# Patient Record
Sex: Female | Born: 1990 | Hispanic: Yes | Marital: Married | Smoking: Never smoker
Health system: Southern US, Community
[De-identification: ages and names within clinical notes are randomized; demographics above are authoritative.]

## PROBLEM LIST (undated history)

## (undated) DIAGNOSIS — E663 Overweight: Secondary | ICD-10-CM

## (undated) DIAGNOSIS — K8020 Calculus of gallbladder without cholecystitis without obstruction: Secondary | ICD-10-CM

## (undated) DIAGNOSIS — Z789 Other specified health status: Secondary | ICD-10-CM

## (undated) HISTORY — DX: Overweight: E66.3

## (undated) HISTORY — PX: NO PAST SURGERIES: SHX2092

## (undated) HISTORY — DX: Calculus of gallbladder without cholecystitis without obstruction: K80.20

---

## 2010-10-23 NOTE — L&D Delivery Note (Signed)
Delivery Note At 12:43 AM a viable female was delivered via SVD.  APGAR: 9, 9; weight 3240g.   Placenta status: spontaneous delivery, intanct .  Cord: 3 vessel  with the following complications: none .  Cord pH: n/a  Anesthesia:  None Episiotomy: None Lacerations: None Est. Blood Loss (mL):  Mom to postpartum.  Baby to nursery-stable.  Drucie Ip 09/13/2011, 1:35 AM

## 2011-06-07 ENCOUNTER — Other Ambulatory Visit: Payer: Self-pay | Admitting: Family Medicine

## 2011-06-07 DIAGNOSIS — Z3689 Encounter for other specified antenatal screening: Secondary | ICD-10-CM

## 2011-06-07 LAB — ANTIBODY SCREEN: Antibody Screen: NEGATIVE

## 2011-06-07 LAB — RPR: RPR: NONREACTIVE

## 2011-06-07 LAB — CYTOLOGY - PAP
Glucose, 1 Hour GTT: 83
Hemoglobin, Elp: NORMAL

## 2011-06-07 LAB — HEPATITIS B SURFACE ANTIGEN: Hepatitis B Surface Ag: NEGATIVE

## 2011-06-07 LAB — ABO/RH: RH Type: POSITIVE

## 2011-06-07 LAB — CBC: Platelets: 189 10*3/uL (ref 150–399)

## 2011-06-07 LAB — GC/CHLAMYDIA PROBE AMP, GENITAL: Chlamydia: NEGATIVE

## 2011-06-08 ENCOUNTER — Ambulatory Visit (HOSPITAL_COMMUNITY)
Admission: RE | Admit: 2011-06-08 | Discharge: 2011-06-08 | Disposition: A | Discharge status: Home / Self Care | Payer: Medicaid Other | Source: Ambulatory Visit | Attending: Family Medicine | Admitting: Family Medicine

## 2011-06-08 DIAGNOSIS — Z363 Encounter for antenatal screening for malformations: Secondary | ICD-10-CM | POA: Insufficient documentation

## 2011-06-08 DIAGNOSIS — Z3689 Encounter for other specified antenatal screening: Secondary | ICD-10-CM

## 2011-06-08 DIAGNOSIS — Z1389 Encounter for screening for other disorder: Secondary | ICD-10-CM | POA: Insufficient documentation

## 2011-06-08 DIAGNOSIS — O358XX0 Maternal care for other (suspected) fetal abnormality and damage, not applicable or unspecified: Secondary | ICD-10-CM | POA: Insufficient documentation

## 2011-06-08 DIAGNOSIS — O0930 Supervision of pregnancy with insufficient antenatal care, unspecified trimester: Secondary | ICD-10-CM | POA: Insufficient documentation

## 2011-06-08 LAB — US OB DETAIL + 14 WK

## 2011-06-08 IMAGING — US US OB DETAIL+14 WK
1 series · 12 of 28 positions shown · non-contrast
Comparison: none

[Series 1: us ob detail +14 wk · 80 acquisitions, 12 frames shown]
[im 3/80]
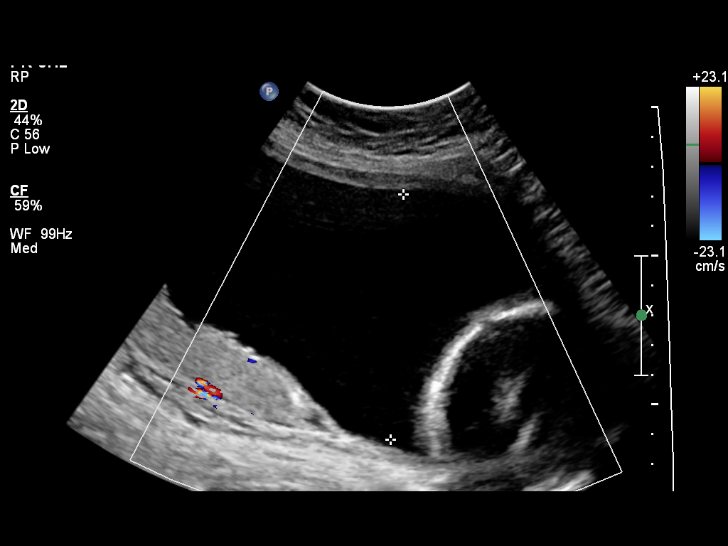
[im 9/80]
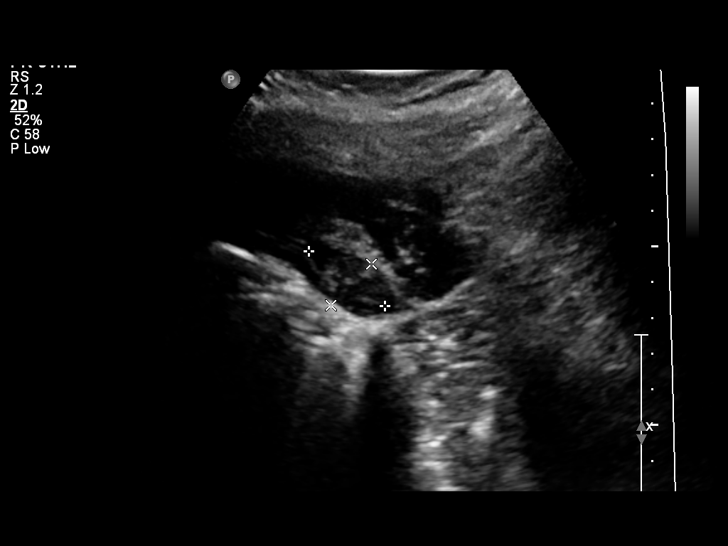
[im 15/80]
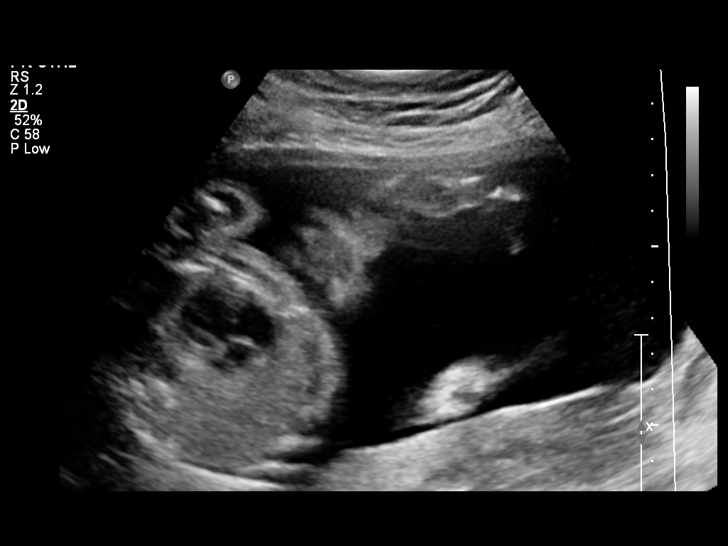
[im 24/80]
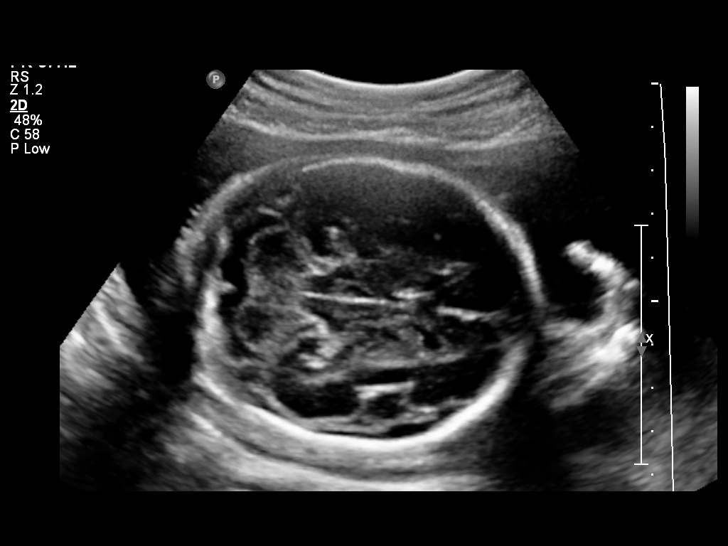
[im 30/80]
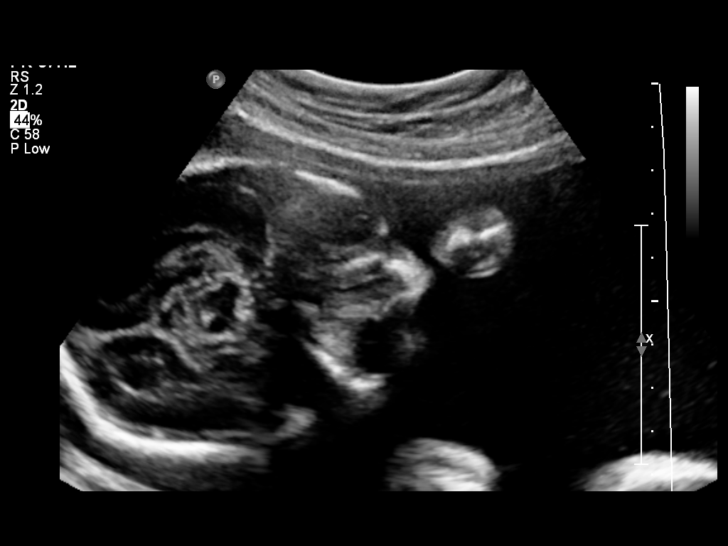
[im 36/80]
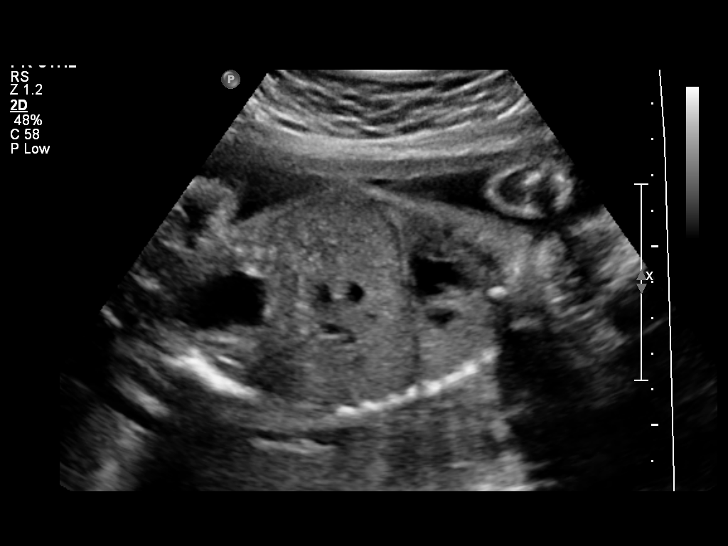
[im 44/80]
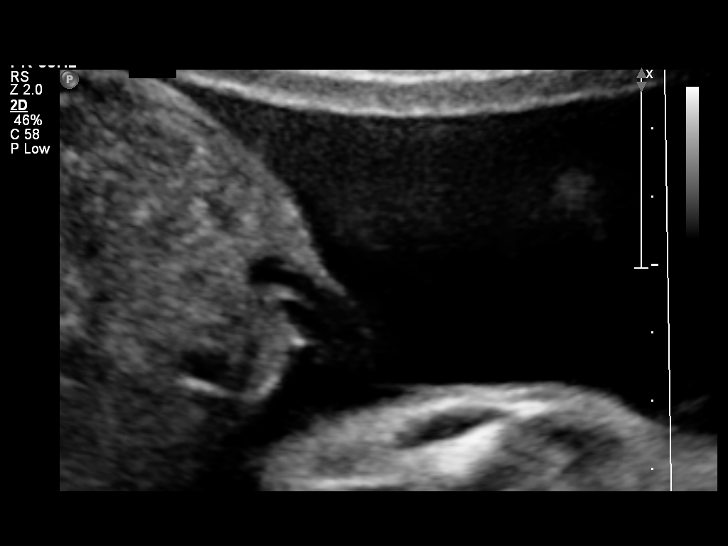
[im 50/80]
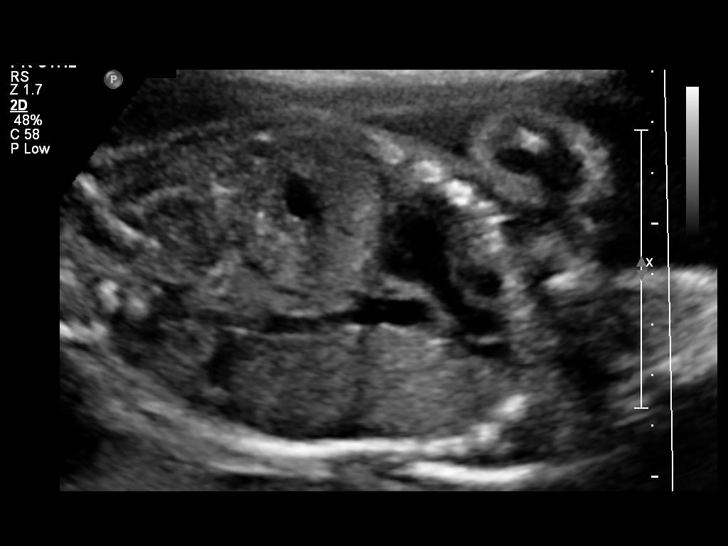
[im 56/80]
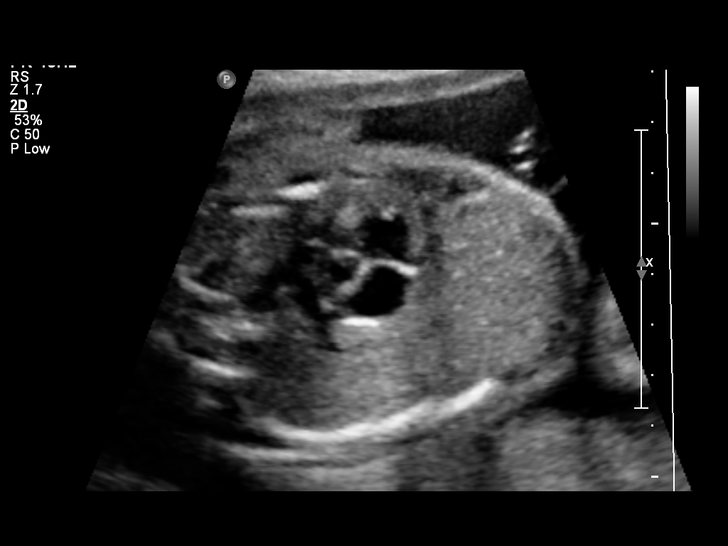
[im 65/80]
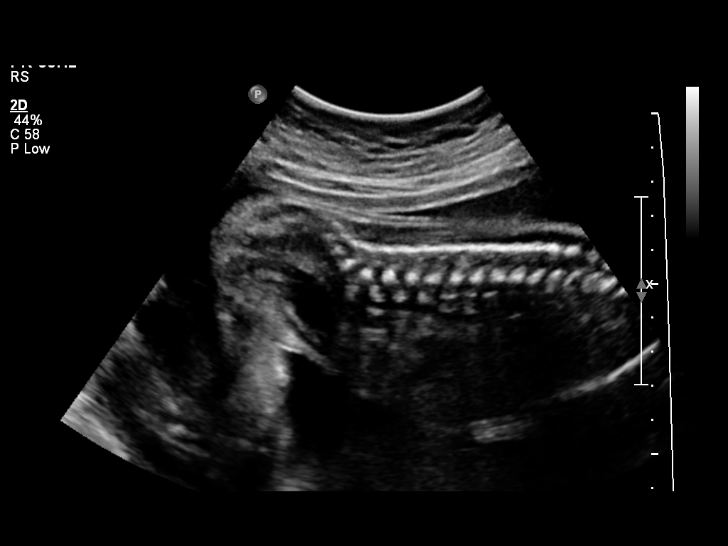
[im 71/80]
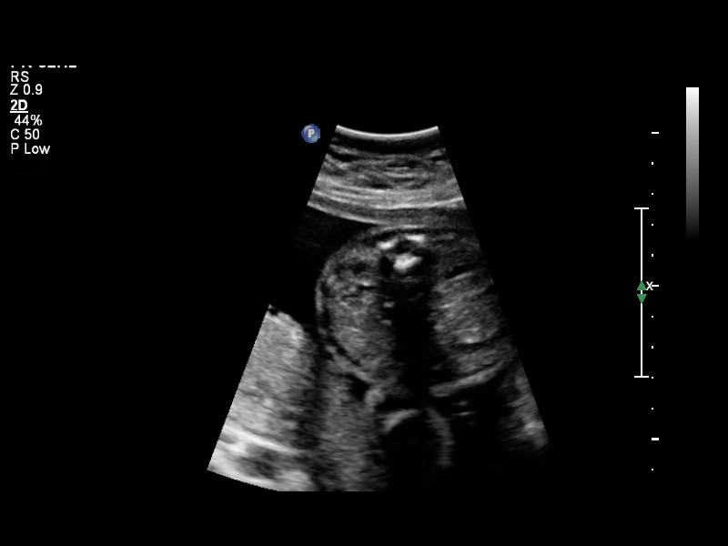
[im 77/80]
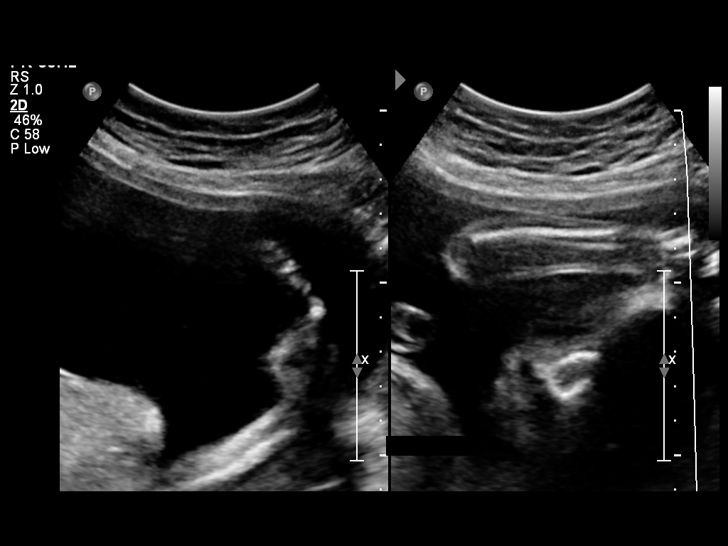

[12 of 28 positions shown; findings below may reference images not displayed]

OBSTETRICS REPORT
                      (Signed Final [DATE] [DATE])

 Order#:         [PHONE_NUMBER]_O
Procedures

 US OB DETAIL + 14 WK                                  76811.0
Indications

 No or Little Prenatal Care                            [02]
 Detailed fetal anatomic survey                        655.83 [02]
Fetal Evaluation

 Fetal Heart Rate:  153                          bpm
 Cardiac Activity:  Observed
 Presentation:      Cephalic
 Placenta:          Posterior, above cervical
                    os
 P. Cord            Visualized
 Insertion:

 Amniotic Fluid
 AFI FV:      Subjectively within normal limits
                                             Larg Pckt:     8.2  cm
Biometry

 BPD:     65.4  mm     G. Age:  26w 3d                CI:        72.76   70 - 86
                                                      FL/HC:      18.0   18.6 -

 HC:     243.8  mm     G. Age:  26w 3d       42  %    HC/AC:      1.11   1.04 -

 AC:     219.8  mm     G. Age:  26w 3d       55  %    FL/BPD:     67.1   71 - 87
 FL:      43.9  mm     G. Age:  24w 3d        5  %    FL/AC:      20.0   20 - 24

 Est. FW:     845  gm    1 lb 14 oz      49  %
Gestational Age

 LMP:           21w 3d        Date:  [DATE]                 EDD:   [DATE]
 U/S Today:     26w 0d                                        EDD:   [DATE]
 Best:          26w 0d     Det. By:  U/S ([DATE])           EDD:   [DATE]
Anatomy

 Cranium:           Appears normal      Aortic Arch:       Appears normal
 Fetal Cavum:       Appears normal      Ductal Arch:       Appears normal
 Ventricles:        Appears normal      Diaphragm:         Appears normal
 Choroid Plexus:    Appears normal      Stomach:           Appears
                                                           normal, left
                                                           sided
 Cerebellum:        Appears normal      Abdomen:           Appears normal
 Posterior Fossa:   Appears normal      Abdominal Wall:    Appears nml
                                                           (cord insert,
                                                           abd wall)
 Nuchal Fold:       Not applicable      Cord Vessels:      Appears normal
                    (>20 wks GA)                           (3 vessel cord)
 Face:              Appears normal      Kidneys:           Appear normal
                    (lips/profile/orbit
                    s)
 Heart:             Appears normal      Bladder:           Appears normal
                    (4 chamber &
                    axis)
 RVOT:              Appears normal      Spine:             Appears normal
 LVOT:              Appears normal      Limbs:             Appears normal
                                                           (hands, ankles,
                                                           feet)

 Other:     Heels and 5th digit visualized. Fetus appears to be a
            male.
Cervix Uterus Adnexa

 Cervical Length:    3.8      cm

 Cervix:       Normal appearance by transabdominal scan.
 Left Ovary:    Not visualized.
 Right Ovary:   Within normal limits.

 Adnexa:     No abnormality visualized.
Impression

 Single intrauterine gestation demonstrating an estimated
 gestational age by ultrasound of 26w 0d  . This is 4w 4d
 ahead of expected EGA by LMP of 21w 3d and corresponds
 with best dating by today's exam  .

 No focal fetal or placental abnormalities are noted with a
 good anatomic evaluation possible. No soft markers for Down
 Syndrome are seen. Sonographic modification of Down
 Syndrome risk was not performed as the patient is beyond
 the EGA at which this assessment is typically performed.

 Subjectively and quantitatively normal amniotic fluid volume.
 Normal cervical length and appearance.

## 2011-06-19 DIAGNOSIS — K8020 Calculus of gallbladder without cholecystitis without obstruction: Secondary | ICD-10-CM

## 2011-07-07 ENCOUNTER — Inpatient Hospital Stay (HOSPITAL_COMMUNITY)
Admission: AD | Admit: 2011-07-07 | Discharge: 2011-07-08 | Disposition: A | Discharge status: Home / Self Care | Payer: Medicaid Other | Source: Ambulatory Visit | Attending: Obstetrics and Gynecology | Admitting: Obstetrics and Gynecology

## 2011-07-07 ENCOUNTER — Encounter (HOSPITAL_COMMUNITY): Payer: Self-pay | Admitting: *Deleted

## 2011-07-07 ENCOUNTER — Inpatient Hospital Stay (HOSPITAL_COMMUNITY): Payer: Medicaid Other

## 2011-07-07 DIAGNOSIS — K8020 Calculus of gallbladder without cholecystitis without obstruction: Secondary | ICD-10-CM | POA: Insufficient documentation

## 2011-07-07 DIAGNOSIS — R1011 Right upper quadrant pain: Secondary | ICD-10-CM | POA: Insufficient documentation

## 2011-07-07 DIAGNOSIS — O2390 Unspecified genitourinary tract infection in pregnancy, unspecified trimester: Secondary | ICD-10-CM | POA: Insufficient documentation

## 2011-07-07 DIAGNOSIS — O9989 Other specified diseases and conditions complicating pregnancy, childbirth and the puerperium: Secondary | ICD-10-CM | POA: Insufficient documentation

## 2011-07-07 DIAGNOSIS — B3731 Acute candidiasis of vulva and vagina: Secondary | ICD-10-CM | POA: Insufficient documentation

## 2011-07-07 DIAGNOSIS — B373 Candidiasis of vulva and vagina: Secondary | ICD-10-CM | POA: Insufficient documentation

## 2011-07-07 HISTORY — DX: Other specified health status: Z78.9

## 2011-07-07 LAB — DIFFERENTIAL
Basophils Absolute: 0 10*3/uL (ref 0.0–0.1)
Basophils Relative: 0 % (ref 0–1)
Eosinophils Absolute: 0 10*3/uL (ref 0.0–0.7)
Monocytes Relative: 5 % (ref 3–12)
Neutro Abs: 8.6 10*3/uL — ABNORMAL HIGH (ref 1.7–7.7)
Neutrophils Relative %: 82 % — ABNORMAL HIGH (ref 43–77)

## 2011-07-07 LAB — URINALYSIS, ROUTINE W REFLEX MICROSCOPIC
Ketones, ur: NEGATIVE mg/dL
Protein, ur: NEGATIVE mg/dL
Urobilinogen, UA: 1 mg/dL (ref 0.0–1.0)

## 2011-07-07 LAB — COMPREHENSIVE METABOLIC PANEL
ALT: 51 U/L — ABNORMAL HIGH (ref 0–35)
AST: 86 U/L — ABNORMAL HIGH (ref 0–37)
Albumin: 2.7 g/dL — ABNORMAL LOW (ref 3.5–5.2)
Alkaline Phosphatase: 154 U/L — ABNORMAL HIGH (ref 39–117)
Chloride: 104 mEq/L (ref 96–112)
Potassium: 3.8 mEq/L (ref 3.5–5.1)
Sodium: 135 mEq/L (ref 135–145)
Total Bilirubin: 0.3 mg/dL (ref 0.3–1.2)
Total Protein: 6.5 g/dL (ref 6.0–8.3)

## 2011-07-07 LAB — WET PREP, GENITAL
Clue Cells Wet Prep HPF POC: NONE SEEN
Trich, Wet Prep: NONE SEEN

## 2011-07-07 LAB — CBC
Hemoglobin: 10.9 g/dL — ABNORMAL LOW (ref 12.0–15.0)
MCHC: 34.1 g/dL (ref 30.0–36.0)
Platelets: 226 10*3/uL (ref 150–400)
RDW: 12.4 % (ref 11.5–15.5)

## 2011-07-07 LAB — URINE MICROSCOPIC-ADD ON

## 2011-07-07 MED ORDER — OXYCODONE-ACETAMINOPHEN 5-325 MG PO TABS
2.0000 | ORAL_TABLET | ORAL | Status: AC | PRN
Start: 2011-07-07 — End: 2011-07-17

## 2011-07-07 MED ORDER — FLUCONAZOLE 150 MG PO TABS
150.0000 mg | ORAL_TABLET | Freq: Once | ORAL | Status: AC
  Administered 2011-07-08: 150 mg via ORAL
  Filled 2011-07-07: qty 1

## 2011-07-07 NOTE — Progress Notes (Signed)
Pt states, " I started vomiting and having pain in my upper abd at 2 pm today. It feels like it radiates into my right low back. I vomited two times, and I've been dizzy."

## 2011-07-07 NOTE — ED Provider Notes (Signed)
History   Patient is a 44-yo G2P1 who presents to [facility]U with worsening of RUQ pain that radiates into her back and around her RIGHT side. She states that she was told she had gall stones during her last pregnancy. She was given medications for pain and told to come in after her pregnancy if her pain returned. She has not had the pain again until recently. Today she vomited x 2, and has been afraid to eat. She last ate around noon. She reports that spicy foods seem to make the pain come on and become worse. She states her pain has lessened some since it started. She denies any diarrhea or fevers. Denies vaginal discharge or vaginal bleeding; her last intercourse was 2 days ago. She has been having contractions on the monitor, but she is not really feeling them by her report. She denies any history of STDs. She reports good fetal movement. She has had no complications with this pregnancy and [facility]  Chief Complaint  Patient presents with  . Abdominal Pain  . Emesis During Pregnancy  . Back Pain   HPI   Past Medical History  Diagnosis Date  . No pertinent past medical history     Past Surgical History  Procedure Date  . No past surgeries     No family history on file.  History  Substance Use Topics  . Smoking status: Never Smoker   . Smokeless tobacco: Not on file  . Alcohol Use: No    Allergies: No Known Allergies  Prescriptions prior to admission  Medication Sig Dispense Refill  . ibuprofen (ADVIL,MOTRIN) 400 MG tablet Take 400 mg by mouth every 6 (six) hours as needed. Patient took medication for pain.       . prenatal vitamin w/FE, FA (PRENATAL 1 + 1) 27-1 MG TABS Take 1 tablet by mouth daily.          Review of Systems  Unable to perform ROS Constitutional: Negative for fever and chills.  Eyes: Negative for blurred vision.  Respiratory: Negative for cough.   Cardiovascular: Negative for chest pain.  Gastrointestinal: Positive for heartburn and  abdominal pain. Negative for nausea, vomiting, diarrhea and constipation.  Genitourinary: Negative for dysuria, urgency, frequency, hematuria and flank pain.  Musculoskeletal: Negative for myalgias.  Skin: Negative for itching and rash.  Neurological: Negative for dizziness and headaches.  Psychiatric/Behavioral: Negative for depression.   Physical Exam   Blood pressure 102/58, pulse 74, temperature 98.6 F (37 C), temperature source Oral, resp. rate 18, height 4' 11.5" (1.511 m), weight 64.071 kg (141 lb 4 oz), unknown if currently breastfeeding.  Physical Exam  Constitutional: She is oriented to person, place, and time. She appears well-developed and well-nourished. She appears distressed.  HENT:  Head: Normocephalic.  Eyes: Pupils are equal, round, and reactive to light.  Neck: Normal range of motion.  Cardiovascular: Normal rate, regular rhythm, normal heart sounds and intact distal pulses.  Exam reveals no gallop and no friction rub.   No murmur heard. Respiratory: Effort normal and breath sounds normal. No respiratory distress. She has no wheezes. She has no rales. She exhibits no tenderness.  GI: Soft. Bowel sounds are normal. She exhibits no distension and no mass. There is tenderness. There is no rebound and no guarding.       +Murphy's sign, increased epigastric tenderness  Genitourinary: Uterus normal. Vaginal discharge found.       Moderate amount of white discharge from the  cervix; cervix with beefy red appearance; no CMT  Musculoskeletal: Normal range of motion.  Neurological: She is alert and oriented to person, place, and time. She has normal reflexes. She displays normal reflexes. No cranial nerve deficit. Coordination normal.  Skin: Skin is warm and dry. No rash noted. She is not diaphoretic. No erythema.   Dilation: Closed Effacement (%): 20 Cervical Position: Posterior Station: -3 Presentation: Undeterminable Exam by:: Dr Natale Milch   MAU Course   Procedures Results for orders placed during the hospital encounter of 07/07/11 (from the past 24 hour(s))  URINALYSIS, ROUTINE W REFLEX MICROSCOPIC     Status: Abnormal   Collection Time   07/07/11  8:03 PM      Component Value Range   Color, Urine YELLOW  YELLOW    Appearance CLEAR  CLEAR    Specific Gravity, Urine 1.015  1.005 - 1.030    pH 7.5  5.0 - 8.0    Glucose, UA NEGATIVE  NEGATIVE (mg/dL)   Hgb urine dipstick NEGATIVE  NEGATIVE    Bilirubin Urine NEGATIVE  NEGATIVE    Ketones, ur NEGATIVE  NEGATIVE (mg/dL)   Protein, ur NEGATIVE  NEGATIVE (mg/dL)   Urobilinogen, UA 1.0  0.0 - 1.0 (mg/dL)   Nitrite NEGATIVE  NEGATIVE    Leukocytes, UA SMALL (*) NEGATIVE   URINE MICROSCOPIC-ADD ON     Status: Normal   Collection Time   07/07/11  8:03 PM      Component Value Range   Squamous Epithelial / LPF RARE  RARE    WBC, UA 3-6  <3 (WBC/hpf)   Bacteria, UA RARE  RARE   CBC     Status: Abnormal   Collection Time   07/07/11  9:18 PM      Component Value Range   WBC 10.6 (*) 4.0 - 10.5 (K/uL)   RBC 3.45 (*) 3.87 - 5.11 (MIL/uL)   Hemoglobin 10.9 (*) 12.0 - 15.0 (g/dL)   HCT 16.1 (*) 09.6 - 46.0 (%)   MCV 92.8  78.0 - 100.0 (fL)   MCH 31.6  26.0 - 34.0 (pg)   MCHC 34.1  30.0 - 36.0 (g/dL)   RDW 04.5  40.9 - 81.1 (%)   Platelets 226  150 - 400 (K/uL)  DIFFERENTIAL     Status: Abnormal   Collection Time   07/07/11  9:18 PM      Component Value Range   Neutrophils Relative 82 (*) 43 - 77 (%)   Neutro Abs 8.6 (*) 1.7 - 7.7 (K/uL)   Lymphocytes Relative 13  12 - 46 (%)   Lymphs Abs 1.4  0.7 - 4.0 (K/uL)   Monocytes Relative 5  3 - 12 (%)   Monocytes Absolute 0.5  0.1 - 1.0 (K/uL)   Eosinophils Relative 0  0 - 5 (%)   Eosinophils Absolute 0.0  0.0 - 0.7 (K/uL)   Basophils Relative 0  0 - 1 (%)   Basophils Absolute 0.0  0.0 - 0.1 (K/uL)  COMPREHENSIVE METABOLIC PANEL     Status: Abnormal   Collection Time   07/07/11  9:18 PM      Component Value Range   Sodium 135  135 - 145  (mEq/L)   Potassium 3.8  3.5 - 5.1 (mEq/L)   Chloride 104  96 - 112 (mEq/L)   CO2 23  19 - 32 (mEq/L)   Glucose, Bld 76  70 - 99 (mg/dL)   BUN 7  6 - 23 (mg/dL)  Creatinine, Ser <0.47 (*) 0.50 - 1.10 (mg/dL)   Calcium 9.0  8.4 - 16.1 (mg/dL)   Total Protein 6.5  6.0 - 8.3 (g/dL)   Albumin 2.7 (*) 3.5 - 5.2 (g/dL)   AST 86 (*) 0 - 37 (U/L)   ALT 51 (*) 0 - 35 (U/L)   Alkaline Phosphatase 154 (*) 39 - 117 (U/L)   Total Bilirubin 0.3  0.3 - 1.2 (mg/dL)   GFR calc non Af Amer NOT CALCULATED  >60 (mL/min)   GFR calc Af Amer NOT CALCULATED  >60 (mL/min)  LIPASE, BLOOD     Status: Normal   Collection Time   07/07/11  9:18 PM      Component Value Range   Lipase 19  11 - 59 (U/L)  AMYLASE     Status: Normal   Collection Time   07/07/11  9:18 PM      Component Value Range   Amylase 57  0 - 105 (U/L)  WET PREP, GENITAL     Status: Abnormal   Collection Time   07/07/11 10:00 PM      Component Value Range   Yeast, Wet Prep MODERATE (*) NONE SEEN    Trich, Wet Prep NONE SEEN  NONE SEEN    Clue Cells, Wet Prep NONE SEEN  NONE SEEN    WBC, Wet Prep HPF POC MANY (*) NONE SEEN    *RADIOLOGY REPORT*  Clinical Data: Elevated LFTs; right upper quadrant abdominal pain,  nausea and vomiting.  ABDOMINAL ULTRASOUND COMPLETE  Comparison: None  Findings:  Gallbladder: Multiple mobile stones are seen layering dependently  within the gallbladder, with question of mild associated sludge.  The largest stone measures 1.0 cm in size. No gallbladder wall  thickening or pericholecystic fluid is seen to suggest  cholecystitis. No ultrasonographic Murphy's sign is elicited.  Common Bile Duct: 0.2 cm in diameter; within normal limits in  caliber.  Liver: Normal parenchymal echogenicity and echotexture; no focal  lesions identified. Limited Doppler evaluation demonstrates normal  blood flow within the liver.  IVC: Unremarkable in appearance.  Pancreas: Although the pancreas is difficult to visualize due  to  overlying bowel gas, no focal pancreatic abnormality is identified.  Spleen: 9.5 cm in length; within normal limits in size and  echotexture.  Right kidney: 9.3 cm in length; normal in size, configuration and  parenchymal echogenicity. No evidence of mass or hydronephrosis.  Left kidney: 10.1 cm in length; normal in size, configuration and  parenchymal echogenicity. No evidence of mass or hydronephrosis.  Abdominal Aorta: Normal in caliber; no aneurysm identified. Not  visualized distally due to the overlying uterus.  IMPRESSION:  1. Cholelithiasis, with mobile stones seen in the gallbladder and  question of mild associated sludge. No evidence for cholecystitis.  2. Otherwise unremarkable abdominal ultrasound.  Original Report Authenticated By: Tonia Ghent, M.D.   Assessment and Plan  1. IUP @ 30.[redacted] wks EGA: Category 1 tracing with contractions, no cervical dilation, wet prep w/ yeast. Pregnancy precautions provided. 2. Cholelithiasis: needs outpatient general surgery consult. Message left with Tresa Endo in [facility] to help with this and we will be contacting the patient Monday regarding any appointments. Precautions provided.  3. Vaginal Candidiasis: Will tx with diflucan x 1 in MAU prior to discharge.  Ellery Meroney N 07/07/2011, 10:05 PM

## 2011-07-07 NOTE — Progress Notes (Signed)
"  I ate lunch at 1200 and began to have pain in my upper right abd that radiated to my mid back and downward.  I vomited twice.  I had this same pain when I was pregnant with my last baby and the MDs told me it was gallbladder problems.  They prescribed my pain meds and everything was fine.  I never had to have my gallbladder removed after delivered."

## 2011-07-08 LAB — GC/CHLAMYDIA PROBE AMP, GENITAL: Chlamydia, DNA Probe: NEGATIVE

## 2011-07-13 ENCOUNTER — Encounter (INDEPENDENT_AMBULATORY_CARE_PROVIDER_SITE_OTHER): Payer: Self-pay | Admitting: Surgery

## 2011-07-13 ENCOUNTER — Ambulatory Visit (INDEPENDENT_AMBULATORY_CARE_PROVIDER_SITE_OTHER): Payer: Medicaid Other | Admitting: Surgery

## 2011-07-13 VITALS — BP 102/88 | HR 60 | Temp 96.6°F | Resp 16 | Ht 61.0 in | Wt 141.0 lb

## 2011-07-13 DIAGNOSIS — K8020 Calculus of gallbladder without cholecystitis without obstruction: Secondary | ICD-10-CM | POA: Insufficient documentation

## 2011-07-13 NOTE — Patient Instructions (Signed)
Discuss gall bladder disease with gyn doctor.  If there is a question, have her gyn doctor call me.

## 2011-07-13 NOTE — Progress Notes (Signed)
ASSESSMENT AND PLAN: 1.  Gallstones, symptomatic.  Given literature on gall bladder disease in Spanish.  She is not acutely ill.  She has no name of a Contractor.  She is going to be followed at [facility] going forward.  I discussed with the patient the indications and risks of gall bladder surgery.  The primary risks of gall bladder surgery include, but are not limited to, bleeding, infection, common bile duct injury, and open surgery.  There is also the risk that the patient may have continued symptoms after surgery. I tried to answer the patient's questions.  We will wait till after her pregnancy until we address this.  2.  Pregnant.  Due September 16, 2011.  Chief Complaint  Patient presents with  . Other    eval of GB with stones   REFERRING PHYSICIAN: Provider Not In System  HISTORY OF PRESENT ILLNESS: Diana Robertson is a 20 y.o. (DOB: Nov 22, 1990)  Hispanic female whose primary care physician is [facility] and comes to me today for gallstones.  The patient was found she has gallstones with her first child. That was 19 months ago. Because she was asymptomatic, I recommended no further treatment.  However over last week, she has had epigastric abdominal pain. A repeat ultrasound on 07 July 2011 revealed mobile gallstones. There was no evidence of cholecystitis. She has mildly elevated liver functions, but in the face of her pregnancy, I do not know what these mean.  She has an uncle and a cousin who have had gallbladder disease. She has no history of peptic ulcer disease, liver disease, pancreatic disease, or colon disease. She's had no prior abdominal surgery.  Elane Fritz acted as Equities trader.    Past Medical History  Diagnosis Date  . No pertinent past medical history     Past Surgical History  Procedure Date  . No past surgeries     Current Outpatient Prescriptions  Medication Sig Dispense Refill  . oxyCODONE-acetaminophen (PERCOCET) 5-325 MG per  tablet Take 2 tablets by mouth every 4 (four) hours as needed for pain.  15 tablet  0  . prenatal vitamin w/FE, FA (PRENATAL 1 + 1) 27-1 MG TABS Take 1 tablet by mouth daily.          No Known Allergies  REVIEW OF SYSTEMS: Skin:  No history of rash.  No history of abnormal moles. Infection:  No history of hepatitis or HIV.  No history of MRSA. Neurologic:  No history of stroke.  No history of seizure.  No history of headaches. Cardiac:  No history of hypertension. No history of heart disease.  No history of prior cardiac catheterization.  No history of seeing a cardiologist. Pulmonary:  Does not smoke cigarettes.  No asthma or bronchitis.  No OSA/CPAP.  Endocrine:  No diabetes. No thyroid disease. Gastrointestinal:  No history of stomach disease.  No history of liver disease.  No history of gall bladder disease.  No history of pancreas disease.  No history of colon disease. Urologic:  No history of kidney stones.  No history of bladder infections. Musculoskeletal:  No history of joint or back disease. Hematologic:  No bleeding disorder.  No history of anemia.  Not anticoagulated.  SOCIAL and FAMILY HISTORY:  Married.  Husband is not here. She has one child who is 39 months old. She is originally from [place]. PHYSICAL EXAM: BP 102/88  Pulse 60  Temp 96.6 F (35.9 C)  Resp 16  Ht 5\' 1"  (  1.549 m)  Wt 141 lb (63.957 kg)  BMI 26.64 kg/m2  General: WN Hispanic female. Pregnant.  Not very big. HEENT: Normal. Pupils equal. Normal dentition. Neck: Supple. No thyroid mass. Lymph Nodes:  No supraclavicular or cervical nodes. Lungs: Clear and symmetric. Heart:  RRR. No murmur. Abdomen: Pregnant.  No mass or tenderness. Rectal: Not done. Extremities:  Good strength in upper and lower extremities. Neurologic:  Grossly intact to motor and sensory function.  DATA REVIEWED: Notes from [facility]. [facility] Dr. Elsie Lincoln (?) with the Health Dept    _____________________________   Ovidio Kin, M.D., FACS

## 2011-07-18 DIAGNOSIS — K8020 Calculus of gallbladder without cholecystitis without obstruction: Secondary | ICD-10-CM | POA: Insufficient documentation

## 2011-07-20 ENCOUNTER — Other Ambulatory Visit: Payer: Self-pay | Admitting: Obstetrics & Gynecology

## 2011-07-20 ENCOUNTER — Ambulatory Visit (INDEPENDENT_AMBULATORY_CARE_PROVIDER_SITE_OTHER): Payer: Medicaid Other | Admitting: Obstetrics & Gynecology

## 2011-07-20 DIAGNOSIS — Z3480 Encounter for supervision of other normal pregnancy, unspecified trimester: Secondary | ICD-10-CM

## 2011-07-20 DIAGNOSIS — K8020 Calculus of gallbladder without cholecystitis without obstruction: Secondary | ICD-10-CM

## 2011-07-20 NOTE — Progress Notes (Signed)
Nutrition Note: Referred for 1st [facility] visit, pt receives [facility] services. Pt dx with gallstones with reoccurring symptoms.   Pt reports medications help with pain, but pt is not making recommended dietary changes.  Pt reports intake of fried, greasy foods on a daily basis.  Pt is drinking Ensure 2-3 times per day, given by [facility]). Pregravid weight at HD recorded at 137-139#. Current weight 141.7# @ [redacted]w[redacted]d gestation.  Weight gain of 4.7# (using 137 pregravid wt) plots 6# < expected.   Pt agrees to limit fried, greasy foods asap. Follow up if referred. Cy Blamer, RD

## 2011-07-20 NOTE — Progress Notes (Signed)
Swelling in feet when standing a lot. No vaginal discharge. Pulse 72  Referred due to gallstone, no current pain or nausea, was seen 9/14 in [facility]U with bilary colic, resolved. Has consulted surgery, will f/u post partum. Will f/u in [facility] in 2 weeks.

## 2011-08-02 ENCOUNTER — Ambulatory Visit (INDEPENDENT_AMBULATORY_CARE_PROVIDER_SITE_OTHER): Payer: Medicaid Other | Admitting: Advanced Practice Midwife

## 2011-08-02 VITALS — BP 92/60 | Temp 98.7°F | Wt 143.0 lb

## 2011-08-02 DIAGNOSIS — K8020 Calculus of gallbladder without cholecystitis without obstruction: Secondary | ICD-10-CM

## 2011-08-02 DIAGNOSIS — Z331 Pregnant state, incidental: Secondary | ICD-10-CM

## 2011-08-02 DIAGNOSIS — Z23 Encounter for immunization: Secondary | ICD-10-CM

## 2011-08-02 LAB — POCT URINALYSIS DIP (DEVICE)
Bilirubin Urine: NEGATIVE
Glucose, UA: NEGATIVE mg/dL
Ketones, ur: NEGATIVE mg/dL
Leukocytes, UA: NEGATIVE
Nitrite: NEGATIVE
pH: 7 (ref 5.0–8.0)

## 2011-08-02 MED ORDER — INFLUENZA VIRUS VACC SPLIT PF IM SUSP
0.5000 mL | Freq: Once | INTRAMUSCULAR | Status: AC
  Administered 2011-08-02: 0.5 mL via INTRAMUSCULAR

## 2011-08-02 NOTE — Progress Notes (Signed)
Pulse 78. Swelling in feet. Pain at times in lower back. No vaginal discharge. Pt signed consent for flu vaccine today. Used interpreter Ruta Hinds.

## 2011-08-02 NOTE — Patient Instructions (Signed)
Embarazo (Tercer trimestre) (Pregnancy - Third Trimester) El tercer trimestre del embarazo (los ltimos 3 meses) es el perodo de cambios ms rpidos que atraviesan usted y el beb. El aumento de peso es ms rpido. El beb alcanza un largo de aproximadamente 50 cm (20 pulgadas) y pesa entre 2,700 y 4,500 kg (6 a 10 libras). El beb gana ms tejido graso y ya est listo para la vida fuera del cuerpo de la madre. Mientras estn en el interior, los bebs tienen perodos de sueo y vigilia, succionan el pulgar y tienen hipo. Quizs sienta pequeas contracciones del tero. Este es el falso trabajo de parto. Tambin se las conoce como contracciones de Braxton-Hicks. Es como una prctica del parto. Los problemas ms habituales de esta etapa del embarazo incluyen mayor dificultad para respirar, hinchazn de las manos y los pies por retencin de lquidos y la necesidad de orinar con ms frecuencia debido a que el tero y el beb presionan sobre la vejiga.  EXAMENES PRENATALES  Durante los exmenes prenatales, deber seguir realizando pruebas de sangre, segn avance el embarazo. Estas pruebas se realizan para controlar su salud y la del beb. Tambin se realizan anlisis de sangre para conocer los niveles de hemoglobina. La anemia (bajo nivel de hemoglobina) es frecuente durante el embarazo. Para prevenirla, se administran hierro y vitaminas. Tambin le harn nuevas pruebas para descartar la diabetes. Podrn repetirle algunas de las pruebas que le hicieron previamente.   En cada visita le medirn el tamao del tero. Es para asegurarse de que el beb se desarrolla correctamente.   Tambin en cada visita la pesarn. Esto se realiza para asegurarse de que aumenta de peso al ritmo indicado y que usted y su beb evolucionan normalmente.   En algunas ocasiones se realiza una ecografa para confirmar el correcto desarrollo y evolucin del beb. Esta prueba se realiza con ondas sonoras inofensivas para el beb, de modo  que el profesional pueda calcular con ms precisin la fecha del parto.   Discuta las posibilidades de la anestesia si necesita cesrea.  Algunas veces se realizan pruebas especializadas del lquido amnitico que rodea al beb. Esta prueba se denomina amniocentesis. El lquido amnitico se obtiene introduciendo una aguja en el abdomen (vientre). En ocasiones se lleva a cabo cerca del final del embarazo, si es necesario adelantar el parto. En este caso se realiza para asegurarse de que los pulmones del beb estn lo suficientemente maduros como para que pueda vivir fuera del tero. CAMBIOS QUE OCURREN EN EL TERCER TRIMESTRE DEL EMBARAZO Su organismo atravesar diferentes cambios durante el embarazo que varan de una persona a otra. Converse con el profesional que la asiste acerca los cambios que usted note y que la preocupen.  Durante el ltimo trimestre probablemente sienta un aumento del apetito. Es normal tener "antojos" de ciertas comidas. Esto vara de una persona a otra y de un embarazo a otro.   Podrn aparecer las primeras estras en las caderas, abdomen y mamas. Estos son cambios normales del cuerpo durante el embarazo. No existen medicamentos ni ejercicios que puedan prevenir estos cambios.   El estreimiento puede tratarse con un laxante o agregando fibra a su dieta. Beber grandes cantidades de lquidos, tomar fibras en forma de verduras, frutas y granos integrales es de gran ayuda.   Tambin es beneficioso practicar actividad fsica. Si ha sido una persona activa hasta el embarazo, podr continuar con la mayora de las actividades durante el mismo. Si ha sido menos activa, puede ser beneficioso que   comience con un programa de ejercicios, como realizar caminatas. Consulte con el profesional que la asiste antes de comenzar un programa de ejercicios.   Evite el consumo de cigarrillos, el alcohol, los medicamentos no prescritos y las "drogas de la calle" durante el embarazo. Estas sustancias  qumicas afectan la formacin y el desarrollo del beb. Evite estas sustancias durante todo el embarazo para asegurar el nacimiento de un beb sano.   Dolor de espalda, venas varicosas y hemorroides podran aparecer o empeorar.   Los movimientos del beb pueden ser ms bruscos y aparecer ms a menudo.   Puede que note dificultades para respirar facilmente.   El ombligo podra salrsele hacia afuera.   Puede segregar un lquido amarillento (calostro) de las mamas.   Puede segregar mucus con sangre. Esto normalmente ocurre unos pocos das a una semana antes de que comience el trabajo de parto.  INSTRUCCIONES PARA EL CUIDADO DOMICILIARIO  La mayor parte de los cuidados que se aconsejan son los mismos que los indicados para las primeras etapas del embarazo. Es importante que concurra a todas las citas con el profesional y siga sus instrucciones con respecto a los medicamentos que deba utilizar, a la actividad fsica y a la dieta.   Durante el embarazo debe obtener nutrientes para usted y para su beb. Consuma alimentos balanceados a intervalos regulares. Elija alimentos como carne, pescado, leche y otros productos lcteos descremados, verduras, frutas, panes integrales y cereales. El profesional le informar cul es el aumento de peso ideal.   Las relaciones sexuales pueden continuarse hasta casi el final del embarazo, si no se presentan otros problemas como prdida prematura (antes de tiempo) de lquido amnitico, hemorragia vaginal o dolor abdominal (en el vientre).   Realice actividad fsica todos los das, si no tiene restricciones. Consulte con el profesional que la asiste si no sabe con certeza si determinados ejercicios son seguros. El mayor aumento de peso se produce en los dos ltimos trimestres del embarazo.   Haga reposo con frecuencia, con las piernas elevadas, o segn lo necesite para evitar los calambres y el dolor de cintura.   Use un buen sostn o como los que se usan para hacer  deportes para aliviar la sensibilidad de las mamas. Tambin puede serle til si lo usa mientras duerme. Si pierde calostro, podr utilizar apsitos en el sostn.   No utilice la baera con agua caliente, baos turcos y saunas.   Colquese el cinturn de seguridad cuando conduzca. Este la proteger a usted y al beb en caso de accidente.   Evite comer carne cruda y el contacto con los utensilios y desperdicios de los gatos. Estos elementos contienen grmenes que pueden causar defectos de nacimiento en el beb.   Es fcil perder algo de orina durante el embarazo. Apretar y fortalecer los msculos de la pelvis la ayudar con este problema. Practique detener la miccin cuando est en el bao. Estos son los mismos msculos que necesita fortalecer. Son tambin los mismos msculos que utiliza cuando trata de evitar los gases. Puede practicar apretando estos msculos diez veces, y repetir esto tres veces por da aproximadamente. Una vez que conozca qu msculos debe contraer, no realice estos ejercicios durante la miccin. Puede favorecerle una infeccin si la orina vuelve hacia atrs.   Pida ayuda si tiene necesidades econmicas, de asesoramiento o nutricionales durante el embarazo. El profesional podr ayudarla con respecto a estas necesidades, o derivarla a otros especialistas.   Practique la ida hasta el hospital a modo de   prueba.   Tome clases prenatales junto con su pareja para comprender, practicar y hacer preguntas acerca del trabajo de parto y el nacimiento.   Prepare la habitacin del beb.   No viaje fuera de la ciudad a menos que sea absolutamente necesario y con el consejo del mdico.   Use slo zapatos bajos sin taco para tener un mejor equilibrio y prevenir cadas.  EL CONSUMO DE MEDICAMENTOS Y DROGAS DURANTE EL EMBARAZO  Contine tomando las vitaminas apropiadas para esta etapa tal como se le indic. Las vitaminas deben contener un miligramo de cido flico y deben suplementarse con  hierro. Guarde todas las vitaminas fuera del alcance de los nios. La ingestin de slo un par de vitaminas o comprimidos que contengan hierro pueden ocasionar la muerte en un beb o en un nio pequeo.   Evite el uso de medicamentos, inclusive los de venta libre, que no hayan sido prescritos o indicados por el profesional que la asiste. Algunos medicamentos pueden causar problemas fsicos al beb. Utilice los medicamentos de venta libre o de prescripcin para el dolor, el malestar o la fiebre, segn se lo indique el profesional que lo asiste. No utilice aspirina, ibuprofeno (Motrin, Advil, Nuprin) o naproxeno (Aleve) a menos que el profesional la autorice.   El alcohol se asocia a cierto nmero de defectos del nacimiento, incluido el sndrome de alcoholismo fetal. Debe evitar el consumo de alcohol en cualquiera de sus formas. El cigarrillo causa nacimientos prematuros y bebs de bajo peso al nacer. Las drogas de la calle son muy nocivas para el beb y estn absolutamente prohibidas. Un beb que nace de una madre adicta, ser adicto al nacer. Ese beb tendr los mismos sntomas de abstinencia que un adulto.   Infrmele al profesional si consume alguna droga.  SOLICITE ATENCIN MDICA SI: Tiene alguna preocupacin durante el embarazo. Es mejor que llame para formular las preguntas si no puede esperar hasta la prxima visita, que sentirse preocupada por ellas.  DECISIONES ACERCA DE LA CIRCUNCISIN Usted puede saber o no cul es el sexo de su beb. Si es un varn, ste es el momento de pensar acerca de la circuncisin. La circuncisin es la extirpacin del prepucio. Esta es la piel que cubre el extremo sensible del pene. No hay un motivo mdico que lo justifique. Generalmente la decisin se toma segn lo que sea popular en ese momento, o se basa en creencias religiosas. Podr conversar estos temas con el profesional que la asiste. SOLICITE ATENCIN MDICA DE INMEDIATO SI:  La temperatura oral se eleva  sin motivo por encima de 100.5 o segn le indique el profesional que la asiste.   Tiene una prdida de lquido por la vagina (canal de parto). Si sospecha una ruptura de las membranas, tmese la temperatura y llame al profesional para informarlo sobre esto.   Observa unas pequeas manchas, una hemorragia vaginal o elimina cogulos. Avsele al profesional acerca de la cantidad y de cuntos apsitos est utilizando.   Presenta un olor desagradable en la secrecin vaginal y observa un cambio en el color, de transparente a blanco.   Ha vomitado durante ms de 24 horas.   Presenta escalofros o fiebre.   Comienza a sentir falta de aire.   Siente ardor al orinar.   Baja o sube ms de 900 g (ms de 2 libras), o segn lo indicado por el profesional que la asiste. Observa que sbitamente se le hinchan el rostro, las manos, los pies o las piernas.   Presenta   dolor abdominal. Las molestias en el ligamento redondo son una causa benigna (no cancerosa) frecuente de dolor abdominal durante el embarazo, pero el profesional que la asiste deber evaluarlo.   Presenta dolor de cabeza intenso que no se alivia.   Si no siente los movimientos del beb durante ms de tres horas. Si piensa que el beb no se mueve tanto como lo haca habitualmente, coma algo que contenga azcar y recustese sobre el lado izquierdo durante una hora. El beb debe moverse al menos 4  5 veces por hora. Comunquese inmediatamente si el beb se mueve menos que lo indicado.   Se cae, se ve involucrada en un accidente automovilstico o sufre algn tipo de traumatismo.   En su hogar hay violencia mental o fsica.  Document Released: 07/19/2005 Document Re-Released: 08/06/2009 [employer] Patient Information 2011 [employer]. 

## 2011-08-02 NOTE — Progress Notes (Signed)
I assessed this pt and agree w/ previous note. Diana Robertson 08/02/2011 10:36 AM

## 2011-08-02 NOTE — Progress Notes (Signed)
The patient has a previous diagnosis of gallstones. She complains of no pain associated with this diagnosis today. She has not required the pain medications previously prescribed recently. The patient denies abdominal pain, contractions, LOF or vaginal bleeding or discharge today. She has no additional complaints today. Baby is moving well. Preterm labor precautions discussed. Patient voices understanding. GBS to be performed at next visit. GC/CT negative on 07/07/11. Follow-up in 2 weeks.

## 2011-08-16 ENCOUNTER — Encounter: Payer: Self-pay | Admitting: Obstetrics & Gynecology

## 2011-08-16 ENCOUNTER — Encounter: Payer: Self-pay | Admitting: Family Medicine

## 2011-08-16 ENCOUNTER — Ambulatory Visit (INDEPENDENT_AMBULATORY_CARE_PROVIDER_SITE_OTHER): Payer: Medicaid Other | Admitting: Obstetrics and Gynecology

## 2011-08-16 ENCOUNTER — Other Ambulatory Visit: Payer: Self-pay | Admitting: Obstetrics and Gynecology

## 2011-08-16 VITALS — BP 104/70 | Temp 97.8°F | Wt 145.9 lb

## 2011-08-16 DIAGNOSIS — Z112 Encounter for screening for other bacterial diseases: Secondary | ICD-10-CM

## 2011-08-16 DIAGNOSIS — Z331 Pregnant state, incidental: Secondary | ICD-10-CM

## 2011-08-16 LAB — POCT URINALYSIS DIP (DEVICE)
Hgb urine dipstick: NEGATIVE
Nitrite: NEGATIVE
Protein, ur: NEGATIVE mg/dL
Specific Gravity, Urine: 1.025 (ref 1.005–1.030)
Urobilinogen, UA: 1 mg/dL (ref 0.0–1.0)
pH: 6.5 (ref 5.0–8.0)

## 2011-08-16 LAB — STREP B DNA PROBE: GBS: NEGATIVE

## 2011-08-16 NOTE — Progress Notes (Signed)
Patient is a 20 year old Spanish-speaking Hispanic female gravida 2 para 1001 with no complaints. M. and Asian fetal heart was in the left lower quadrant at 122 per minute. Smears were taken for GC Chlamydia and GBS.  Patient will be seen weekly until delivery. Normal pregnancy surveillance.

## 2011-08-16 NOTE — Progress Notes (Signed)
P=95, Used Interpreter [employer], c/o edema in feet only,c/o pelvic pain/pressure only when walks a lot-" feels like something is coming out"

## 2011-08-23 ENCOUNTER — Ambulatory Visit (INDEPENDENT_AMBULATORY_CARE_PROVIDER_SITE_OTHER): Payer: Medicaid Other | Admitting: Family Medicine

## 2011-08-23 DIAGNOSIS — Z331 Pregnant state, incidental: Secondary | ICD-10-CM

## 2011-08-23 DIAGNOSIS — Z23 Encounter for immunization: Secondary | ICD-10-CM

## 2011-08-23 DIAGNOSIS — O26619 Liver and biliary tract disorders in pregnancy, unspecified trimester: Secondary | ICD-10-CM

## 2011-08-23 LAB — POCT URINALYSIS DIP (DEVICE)
Bilirubin Urine: NEGATIVE
Hgb urine dipstick: NEGATIVE
Ketones, ur: NEGATIVE mg/dL
Protein, ur: NEGATIVE mg/dL
Specific Gravity, Urine: 1.02 (ref 1.005–1.030)
pH: 7.5 (ref 5.0–8.0)

## 2011-08-23 MED ORDER — INFLUENZA VIRUS VACC SPLIT PF IM SUSP: 0.5000 mL | Freq: Once | INTRAMUSCULAR | Status: DC

## 2011-08-23 MED ORDER — TETANUS-DIPHTH-ACELL PERTUSSIS 5-2.5-18.5 LF-MCG/0.5 IM SUSP
0.5000 mL | Freq: Once | INTRAMUSCULAR | Status: AC
  Administered 2011-08-23: 0.5 mL via INTRAMUSCULAR

## 2011-08-23 NOTE — Progress Notes (Signed)
Subjective:    Diana Robertson is a 20 y.o. female being seen today for her obstetrical visit. She is at [redacted]w[redacted]d gestation. Patient reports no bleeding, no contractions and leaking fluid since going to the bathroom just minutes ago.. Fetal movement: normal.  Menstrual History:   Objective:    BP 116/73  Temp 97.1 F (36.2 C)  Wt 147 lb 12.8 oz (67.042 kg)  Breastfeeding? Unknown FHT: 130 BPM  Uterine Size: 37 cm  Presentations: cephalic  Pelvic Exam:              Dilation: 3cm       Effacement: 50%             Station:  -2    Consistency: medium            Position: middle   [place] Slide Negative  Assessment:    Pregnancy 36 and 6/7 weeks  Vaginal Discharge in pregnancy  Plan:   Plans for delivery: Vaginal anticipated Beta strep culture: results reviewed: negative Counseling: Pediatrician discussed. Infant feeding: plans to breastfeed. Plans to use OCPs for postpartum birth control Wet prep sent since Crist Fat was negative TDaP today; had flu vaccine last visit Follow up in 1 Week.

## 2011-08-23 NOTE — Patient Instructions (Signed)
Ihor Dow y parto normal (Normal Labor and Delivery) Licensed conveyancer, su mdico debe estar seguro de que usted est en trabajo de [place]. Algunos signos son:  Puede haber eliminado el "tapn mucoso" antes que comience el trabajo de [place]. Se trata de una pequea cantidad de mucus con sangre.   Tiene contracciones uterinas regulares.   El [employer] las contracciones se acorta.   Las molestias y Chief Technology Officer se hacen gradualmente ms intensos.   El dolor se ubica principalmente en la espalda.   Los dolores empeoran al [employer].   El cuello del tero (la apertura del tero se hace ms delgada, comienza a borrarse, y se abre (se dilata).  Una vez que se encuentre en [place] parto y sea admitida en el hospital, el mdico har lo siguiente:  Un examen fsico completo.   Controlar sus signos vitales (presin arterial, pulso, temperatura y la frecuencia cardaca fetal).   Realizar un examen vaginal (usando un guante estril y lubricante para determinar:   La posicin (presentacin) del beb (ceflica [vertex] o nalgas primero).   El nivel (plano) de la cabeza del beb en el canal de parto.   El borramiento y dilatacin del cuello del tero.   Le rasurarn el vello pbico y le aplicarn una enema segn lo considere el mdico y las circunstancias.   Generalmente se coloca un monitor electrnico sobre el abdomen. El monitor sigue la duracin e intensidad de las contracciones, as como la frecuencia cardaca del beb.   Generalmente, el profesional inserta una va intravenosa en el brazo para administrarle agua azucarada. Esta es una medida de precaucin, de modo que puedan administrarle rpidamente medicamentos durante el [place].  EL TRABAJO DE PARTO Y PARTO NORMALES SE DIVIDEN EN 3 ETAPAS: Primera etapa Comienzan las contracciones regulares y el cuello comienza a borrarse y dilatarse. Esta etapa puede durar entre 3 y 15 horas. El final de la primera etapa se considera  cuando el cuello est borrado en un 100% y se ha dilatado 10 cm. Le administrarn analgsicos por:  Inyeccin (morfina, demerol, etc.).   Anestesia regional (espinal, caudal o epidural, anestsicos colocados en diferentes regiones de la columna vertebral). Podrn administrarle medicamentos para el dolor en la regin paracervical, que consiste en la aplicacin de un anestsico inyectable en cada uno de los lados del cuello del tero.  La embarazada puede requerir un "parto natural" , es decir no recibir [employer] o anestesia durante el [place] de parto y [place]. Segunda etapa En este momento el beb baja a travs del canal de parto (vagina) y nace. Esto puede durar entre 1 y 4 horas. A medida que el beb asoma la cabeza por el canal de parto, podr sentir una sensacin similar a cuando mueve el intestino. Sentir el impulse de empujar con fuerza hasta que el nio salga. A medida que la cabecita baja, el mdico decidir si realiza una episiotoma (corte en el perineo y rea de la vagina) para evitar la ruptura de los tejidos). Luego del nacimiento del beb y la expulsin de la placenta, la episiotoma se sutura. En algunos casos se coloca a la madre una mscara con xido nitroso para Research officer, political party respiracin y Engineer, materials. El final de la etapa 2 se produce cuando el beb ha salido completamente. Luego, cuando el cordn umbilical deja de pulsar, se pinza y se corta. Tercera etapa La tercera etapa comienza luego que el beb ha nacido y finaliza luego de la  expulsin de la placenta. Generalmente esto lleva entre 5 y 30 minutos. Luego de la expulsin de la placenta, le aplicarn un medicamento por va intravenosa para ayudar a Engineer, materials y Psychiatric nurse. En la tercera etapa no hay dolor y generalmente no son necesarios los analgsicos. Si le han realizado una episiotoma, es el momento de Sales promotion account executive. Luego del parto, la mam es observada y controlada exhaustivamente durante 1  2 horas  para verificar que no hay sangrado en el post parto (hemorragias). Si pierde [employer], le administrarn un medicamento para Engineer, manufacturing tero y Comptroller. Document Released: 09/21/2008 Document Revised: 06/21/2011 [facility] Patient Information 2012 [place], [place].

## 2011-08-23 NOTE — Progress Notes (Signed)
Swelling in feet. Pelvic pressure. Pt signed consent for Tdap vaccine. Pulse 93. Used interpreter Ruta Hinds.

## 2011-08-24 LAB — WET PREP, GENITAL
Clue Cells Wet Prep HPF POC: NONE SEEN
WBC, Wet Prep HPF POC: NONE SEEN

## 2011-08-30 ENCOUNTER — Other Ambulatory Visit: Payer: Self-pay | Admitting: Advanced Practice Midwife

## 2011-08-30 ENCOUNTER — Ambulatory Visit (INDEPENDENT_AMBULATORY_CARE_PROVIDER_SITE_OTHER): Payer: Medicaid Other | Admitting: Family Medicine

## 2011-08-30 VITALS — BP 108/77 | Temp 98.1°F | Wt 151.6 lb

## 2011-08-30 DIAGNOSIS — Z3480 Encounter for supervision of other normal pregnancy, unspecified trimester: Secondary | ICD-10-CM

## 2011-08-30 LAB — POCT URINALYSIS DIP (DEVICE)
Bilirubin Urine: NEGATIVE
Glucose, UA: NEGATIVE mg/dL
Ketones, ur: NEGATIVE mg/dL
Protein, ur: NEGATIVE mg/dL

## 2011-08-30 NOTE — Progress Notes (Signed)
No complaints or questions.  No contractions, vaginal discharge, vaginal bleeding.   Follow up in 1 weeks.

## 2011-08-30 NOTE — Progress Notes (Signed)
Edema-feet.  Pulse-83. Pain/pressure-lower abd.

## 2011-08-30 NOTE — Patient Instructions (Signed)
Embarazo - Tercer trimestre (Pregnancy - Third Trimester) El tercer trimestre del embarazo (los ltimos 3 meses) es el perodo de cambios ms rpidos que atraviesan usted y el beb. El aumento de peso es ms rpido. El beb alcanza un largo de aproximadamente 50 cm (20 pulgadas) y pesa entre 2,700 y 4,500 kg (6 a 10 libras). El beb gana ms tejido graso y ya est listo para la vida fuera del cuerpo de la madre. Mientras estn en el interior, los bebs tienen perodos de sueo y vigilia, succionan el pulgar y tienen hipo. Quizs sienta pequeas contracciones del tero. Este es el falso trabajo de parto. Tambin se las conoce como contracciones de Braxton-Hicks. Es como una prctica del parto. Los problemas ms habituales de esta etapa del embarazo incluyen mayor dificultad para respirar, hinchazn de las manos y los pies por retencin de lquidos y la necesidad de orinar con ms frecuencia debido a que el tero y el beb presionan sobre la vejiga.  EXAMENES PRENATALES  Durante los exmenes prenatales, deber seguir realizando pruebas de sangre, segn avance el embarazo. Estas pruebas se realizan para controlar su salud y la del beb. Tambin se realizan anlisis de sangre para conocer los niveles de hemoglobina. La anemia (bajo nivel de hemoglobina) es frecuente durante el embarazo. Para prevenirla, se administran hierro y vitaminas. Tambin le harn nuevas pruebas para descartar la diabetes. Podrn repetirle algunas de las pruebas que le hicieron previamente.   En cada visita le medirn el tamao del tero. Es para asegurarse de que el beb se desarrolla correctamente.   Tambin en cada visita la pesarn. Esto se realiza para asegurarse de que aumenta de peso al ritmo indicado y que usted y su beb evolucionan normalmente.   En algunas ocasiones se realiza una ecografa para confirmar el correcto desarrollo y evolucin del beb. Esta prueba se realiza con ondas sonoras inofensivas para el beb, de modo  que el profesional pueda calcular con ms precisin la fecha del parto.   Discuta las posibilidades de la anestesia si necesita cesrea.  Algunas veces se realizan pruebas especializadas del lquido amnitico que rodea al beb. Esta prueba se denomina amniocentesis. El lquido amnitico se obtiene introduciendo una aguja en el abdomen (vientre). En ocasiones se lleva a cabo cerca del final del embarazo, si es necesario adelantar el parto. En este caso se realiza para asegurarse de que los pulmones del beb estn lo suficientemente maduros como para que pueda vivir fuera del tero. CAMBIOS QUE OCURREN EN EL TERCER TRIMESTRE DEL EMBARAZO Su organismo atravesar diferentes cambios durante el embarazo que varan de una persona a otra. Converse con el profesional que la asiste acerca los cambios que usted note y que la preocupen.  Durante el ltimo trimestre probablemente sienta un aumento del apetito. Es normal tener "antojos" de ciertas comidas. Esto vara de una persona a otra y de un embarazo a otro.   Podrn aparecer las primeras estras en las caderas, abdomen y mamas. Estos son cambios normales del cuerpo durante el embarazo. No existen medicamentos ni ejercicios que puedan prevenir estos cambios.   El estreimiento puede tratarse con un laxante o agregando fibra a su dieta. Beber grandes cantidades de lquidos, tomar fibras en forma de verduras, frutas y granos integrales es de gran ayuda.   Tambin es beneficioso practicar actividad fsica. Si ha sido una persona activa hasta el embarazo, podr continuar con la mayora de las actividades durante el mismo. Si ha sido menos activa, puede ser beneficioso   que comience con un programa de ejercicios, como realizar caminatas. Consulte con el profesional que la asiste antes de comenzar un programa de ejercicios.   Evite el consumo de cigarrillos, el alcohol, los medicamentos no prescritos y las "drogas de la calle" durante el embarazo. Estas sustancias  qumicas afectan la formacin y el desarrollo del beb. Evite estas sustancias durante todo el embarazo para asegurar el nacimiento de un beb sano.   Dolor de espalda, venas varicosas y hemorroides podran aparecer o empeorar.   Los movimientos del beb pueden ser ms bruscos y aparecer ms a menudo.   Puede que note dificultades para respirar facilmente.   El ombligo podra salrsele hacia afuera.   Puede segregar un lquido amarillento (calostro) de las mamas.   Puede segregar mucus con sangre. Esto normalmente ocurre unos pocos das a una semana antes de que comience el trabajo de parto.  INSTRUCCIONES PARA EL CUIDADO DOMICILIARIO  La mayor parte de los cuidados que se aconsejan son los mismos que los indicados para las primeras etapas del embarazo. Es importante que concurra a todas las citas con el profesional y siga sus instrucciones con respecto a los medicamentos que deba utilizar, a la actividad fsica y a la dieta.   Durante el embarazo debe obtener nutrientes para usted y para su beb. Consuma alimentos balanceados a intervalos regulares. Elija alimentos como carne, pescado, leche y otros productos lcteos descremados, verduras, frutas, panes integrales y cereales. El profesional le informar cul es el aumento de peso ideal.   Las relaciones sexuales pueden continuarse hasta casi el final del embarazo, si no se presentan otros problemas como prdida prematura (antes de tiempo) de lquido amnitico, hemorragia vaginal o dolor abdominal (en el vientre).   Realice actividad fsica todos los das, si no tiene restricciones. Consulte con el profesional que la asiste si no sabe con certeza si determinados ejercicios son seguros. El mayor aumento de peso se produce en los dos ltimos trimestres del embarazo.   Haga reposo con frecuencia, con las piernas elevadas, o segn lo necesite para evitar los calambres y el dolor de cintura.   Use un buen sostn o como los que se usan para hacer  deportes para aliviar la sensibilidad de las mamas. Tambin puede serle til si lo usa mientras duerme. Si pierde calostro, podr utilizar apsitos en el sostn.   No utilice la baera con agua caliente, baos turcos y saunas.   Colquese el cinturn de seguridad cuando conduzca. Este la proteger a usted y al beb en caso de accidente.   Evite comer carne cruda y el contacto con los utensilios y desperdicios de los gatos. Estos elementos contienen grmenes que pueden causar defectos de nacimiento en el beb.   Es fcil perder algo de orina durante el embarazo. Apretar y fortalecer los msculos de la pelvis la ayudar con este problema. Practique detener la miccin cuando est en el bao. Estos son los mismos msculos que necesita fortalecer. Son tambin los mismos msculos que utiliza cuando trata de evitar los gases. Puede practicar apretando estos msculos diez veces, y repetir esto tres veces por da aproximadamente. Una vez que conozca qu msculos debe contraer, no realice estos ejercicios durante la miccin. Puede favorecerle una infeccin si la orina vuelve hacia atrs.   Pida ayuda si tiene necesidades econmicas, de asesoramiento o nutricionales durante el embarazo. El profesional podr ayudarla con respecto a estas necesidades, o derivarla a otros especialistas.   Practique la ida hasta el hospital a modo   de prueba.   Tome clases prenatales junto con su pareja para comprender, practicar y hacer preguntas acerca del trabajo de parto y el nacimiento.   Prepare la habitacin del beb.   No viaje fuera de la ciudad a menos que sea absolutamente necesario y con el consejo del mdico.   Use slo zapatos bajos sin taco para tener un mejor equilibrio y prevenir cadas.  EL CONSUMO DE MEDICAMENTOS Y DROGAS DURANTE EL EMBARAZO  Contine tomando las vitaminas apropiadas para esta etapa tal como se le indic. Las vitaminas deben contener un miligramo de cido flico y deben suplementarse con  hierro. Guarde todas las vitaminas fuera del alcance de los nios. La ingestin de slo un par de vitaminas o comprimidos que contengan hierro pueden ocasionar la muerte en un beb o en un nio pequeo.   Evite el uso de medicamentos, inclusive los de venta libre, que no hayan sido prescritos o indicados por el profesional que la asiste. Algunos medicamentos pueden causar problemas fsicos al beb. Utilice los medicamentos de venta libre o de prescripcin para el dolor, el malestar o la fiebre, segn se lo indique el profesional que lo asiste. No utilice aspirina, ibuprofeno (Motrin, Advil, Nuprin) o naproxeno (Aleve) a menos que el profesional la autorice.   El alcohol se asocia a cierto nmero de defectos del nacimiento, incluido el sndrome de alcoholismo fetal. Debe evitar el consumo de alcohol en cualquiera de sus formas. El cigarrillo causa nacimientos prematuros y bebs de bajo peso al nacer. Las drogas de la calle son muy nocivas para el beb y estn absolutamente prohibidas. Un beb que nace de una madre adicta, ser adicto al nacer. Ese beb tendr los mismos sntomas de abstinencia que un adulto.   Infrmele al profesional si consume alguna droga.  SOLICITE ATENCIN MDICA SI: Tiene alguna preocupacin durante el embarazo. Es mejor que llame para formular las preguntas si no puede esperar hasta la prxima visita, que sentirse preocupada por ellas.  DECISIONES ACERCA DE LA CIRCUNCISIN Usted puede saber o no cul es el sexo de su beb. Si es un varn, ste es el momento de pensar acerca de la circuncisin. La circuncisin es la extirpacin del prepucio. Esta es la piel que cubre el extremo sensible del pene. No hay un motivo mdico que lo justifique. Generalmente la decisin se toma segn lo que sea popular en ese momento, o se basa en creencias religiosas. Podr conversar estos temas con el profesional que la asiste. SOLICITE ATENCIN MDICA DE INMEDIATO SI:  La temperatura oral se eleva  sin motivo por encima de 102 F (38.9 C) o segn le indique el profesional que la asiste.   Tiene una prdida de lquido por la vagina (canal de parto). Si sospecha una ruptura de las membranas, tmese la temperatura y llame al profesional para informarlo sobre esto.   Observa unas pequeas manchas, una hemorragia vaginal o elimina cogulos. Avsele al profesional acerca de la cantidad y de cuntos apsitos est utilizando.   Presenta un olor desagradable en la secrecin vaginal y observa un cambio en el color, de transparente a blanco.   Ha vomitado durante ms de 24 horas.   Presenta escalofros o fiebre.   Comienza a sentir falta de aire.   Siente ardor al orinar.   [place] o sube ms de 900 g (ms de 2 libras), o segn lo indicado por el profesional que la asiste. Observa que sbitamente se le hinchan el rostro, las manos, los pies o las   piernas.   Presenta dolor abdominal. Las molestias en el ligamento redondo son una causa benigna (no cancerosa) frecuente de dolor abdominal durante el embarazo, pero el profesional que la asiste deber evaluarlo.   Presenta dolor de cabeza intenso que no se alivia.   Si no siente los movimientos del beb durante ms de tres horas. Si piensa que el beb no se mueve tanto como lo haca habitualmente, coma algo que contenga azcar y recustese sobre el lado izquierdo durante una hora. El beb debe moverse al menos 4  5 veces por hora. Comunquese inmediatamente si el beb se mueve menos que lo indicado.   Se cae, se ve involucrada en un accidente automovilstico o sufre algn tipo de traumatismo.   En su hogar hay violencia mental o fsica.  Document Released: 07/19/2005 Document Revised: 06/21/2011 [employer] Patient Information 2012 [employer]. 

## 2011-09-06 ENCOUNTER — Ambulatory Visit (INDEPENDENT_AMBULATORY_CARE_PROVIDER_SITE_OTHER): Payer: Medicaid Other | Admitting: Advanced Practice Midwife

## 2011-09-06 VITALS — BP 102/77 | HR 72 | Temp 97.2°F | Wt 151.4 lb

## 2011-09-06 DIAGNOSIS — Z3480 Encounter for supervision of other normal pregnancy, unspecified trimester: Secondary | ICD-10-CM

## 2011-09-06 MED ORDER — CEPHALEXIN 500 MG PO CAPS: 500.0000 mg | ORAL_CAPSULE | Freq: Four times a day (QID) | ORAL | Status: AC

## 2011-09-06 NOTE — Progress Notes (Signed)
Well, no c/o. Rev'd precautions. Large leuk on UA, rx keflex, urine culture sent. Planning on breastfeeding, education provided.

## 2011-09-06 NOTE — Patient Instructions (Signed)
Glennis Brink - Chartered certified accountant trimestre (Pregnancy - Third Trimester) El tercer trimestre del embarazo (los ltimos 3 meses) es el perodo de cambios ms rpidos que atraviesan usted y el beb. El aumento de peso es ms rpido. El beb alcanza un largo de aproximadamente 50 cm (20 pulgadas) y pesa entre 2,700 y 4,500 kg (6 a 10 libras). El beb gana ms tejido graso y ya est listo para la vida fuera del cuerpo de la [place]. Mientras estn en el interior, los bebs tienen perodos de sueo y vigilia, Armed forces technical officer y tienen 66. Quizs sienta pequeas contracciones del tero. Este es el falso trabajo de [place]. Tambin se las conoce como contracciones de [place]. Es como una prctica del parto. Los problemas ms habituales de esta etapa del embarazo incluyen mayor dificultad para respirar, [place] manos y los pies por retencin de lquidos y la necesidad de Garment/textile technologist con ms frecuencia debido a que el tero y el beb presionan sobre la vejiga.  EXAMENES PRENATALES  Durante los Masco Corporation, deber seguir realizando pruebas de [place], segn avance el [place]. Estas pruebas se realizan para controlar su salud y la del beb. Tambin se realizan anlisis de sangre para Ryland Group niveles de [place]. La anemia (bajo nivel de hemoglobina) es frecuente durante el embarazo. Para prevenirla, se administran hierro y vitaminas. Tambin le harn nuevas pruebas para descartar la diabetes. Podrn repetirle algunas de las Corning Incorporated hicieron previamente.   En cada visita le medirn el tamao del tero. Es para asegurarse de que el beb se desarrolla correctamente.   Tambin en cada visita la pesarn. Esto se realiza para asegurarse de que aumenta de peso al ritmo indicado y que usted y su beb evolucionan normalmente.   En algunas ocasiones se realiza una ecografa para confirmar el correcto desarrollo y evolucin del beb. Esta prueba se realiza con ondas sonoras inofensivas para el beb, de modo  que el profesional pueda calcular con ms precisin la fecha del [place].   [place] posibilidades de la anestesia si necesita cesrea.  Algunas veces se realizan pruebas especializadas del lquido amnitico que rodea al beb. Esta prueba se denomina amniocentesis. El lquido amnitico se obtiene introduciendo una aguja en el abdomen (vientre). En ocasiones se lleva a cabo cerca del final del embarazo, si es Tree surgeon. En este caso se realiza para asegurarse de que los pulmones del beb estn lo suficientemente maduros como para que pueda vivir fuera del tero. CAMBIOS QUE OCURREN EN EL TERCER TRIMESTRE DEL EMBARAZO Su organismo atravesar diferentes cambios durante el embarazo que varan de Ardelia Mems persona a Theatre manager. Converse con el profesional que la asiste acerca los cambios que usted note y que la preocupen.  Durante el ltimo trimestre probablemente sienta un aumento del apetito. Es normal tener "antojos" de Diplomatic Services operational officer. Esto vara de Ardelia Mems persona a otra y de un embarazo a Lexicographer.   Podrn aparecer las primeras estras en las caderas, abdomen y [place]. Estos son cambios normales del cuerpo durante el [place]. No existen medicamentos ni ejercicios que puedan prevenir Abbott Laboratories.   El estreimiento puede tratarse con un laxante o agregando fibra a su dieta. Beber grandes cantidades de lquidos, tomar fibras en forma de verduras, frutas y granos integrales es de [place].   Tambin es beneficioso practicar actividad fsica. Si ha sido una persona Radio broadcast assistant, podr continuar con la State Farm de las actividades durante el mismo. Si ha sido IAC/InterActiveCorp, puede ser beneficioso  que comience con un programa de ejercicios, como Pension scheme manager. Consulte con el profesional que la asiste antes de comenzar un programa de ejercicios.   Evite el consumo de cigarrillos, el alcohol, los medicamentos no prescritos y las "drogas de la calle" durante el [place]. Estas sustancias  qumicas afectan la formacin y el desarrollo del beb. Evite estas sustancias durante todo el embarazo para asegurar el nacimiento de un beb sano.   Dolor de espalda, venas varicosas y hemorroides podran aparecer o empeorar.   Los movimientos del beb pueden ser ms bruscos y aparecer ms a menudo.   Puede que note dificultades para respirar facilmente.   El ombligo podra salrsele hacia afuera.   Puede segregar un lquido amarillento (calostro) de las Universal.   Puede segregar mucus con sangre. Esto normalmente ocurre unos pocos das a una semana antes de que comience el [place] de [place].  [place] parte de los cuidados que se aconsejan son los mismos que los indicados para las primeras etapas del Media planner. Es importante que concurra a todas las citas con el profesional y siga sus instrucciones con Engineer, site a los medicamentos que deba Risk manager, a la actividad fsica y a Engineer, technical sales.   Durante el embarazo debe obtener nutrientes para usted y para su beb. Consuma alimentos balanceados a intervalos regulares. Elija alimentos como carne, pescado, [place] y otros productos lcteos descremados, verduras, frutas, panes integrales y cereales. El Conservation officer, nature cul es el aumento de peso ideal.   Las relaciones sexuales pueden continuarse hasta casi el final del embarazo, si no se presentan otros problemas como prdida prematura (antes de tiempo) de lquido amnitico, hemorragia vaginal o dolor abdominal (en el vientre).   [place], si no tiene restricciones. Consulte con el profesional que la asiste si no sabe con certeza si determinados ejercicios son seguros. El mayor aumento de peso se produce [place] ltimos trimestres del [place].   Haga reposo con frecuencia, con las piernas elevadas, o segn lo necesite para evitar los calambres y el dolor de cintura.   Use un buen sostn o como los que se usan para hacer  deportes para Public house manager la sensibilidad de las [place]. Tambin puede serle til si lo [place] mientras duerme. Si pierde Adult nurse, podr [employer] Energy.   No utilice la baera con agua caliente, baos turcos y saunas.   Colquese el cinturn de seguridad cuando conduzca. Este la proteger a usted y al beb en caso de accidente.   Evite comer carne cruda y el contacto con los utensilios y desperdicios de los gatos. Estos elementos contienen grmenes que pueden causar defectos de nacimiento en el beb.   Es fcil perder algo de orina durante el [place]. Apretar y Software engineer los msculos de la pelvis la ayudar con este problema. Practique detener la miccin cuando est en el bao. Estos son los mismos msculos que Advertising copywriter. Son Sonic Automotive mismos msculos que utiliza cuando trata de [employer] [place]. [place] [employer]ers gases. Puede practicar apretando estos msculos Western [employer] Southern Financial, y repetir esto tres veces por da aproximadamente. Una vez que conozca qu msculos debe contraer, no realice estos ejercicios durante la miccin. Puede favorecerle una infeccin si la orina vuelve hacia atrs.   Pida ayuda si tiene necesidades econmicas, de asesoramiento o nutricionales durante el [place]. El profesional podr ayudarla con respecto a estas necesidades, o derivarla a otros especialistas.   Practique la ida Goldman Sachs hospital a modo  de prueba.   Tome clases prenatales junto con su pareja para comprender, practicar y hacer preguntas acerca del [place]ne de parto y el nacimiento.   Prepare la habitacin del beb.   No viaje fuera de la ciudad a menos que sea absolutamente necesario y con el consejo del mdico.   Use slo zapatos bajos sin taco para tener un mejor equilibrio y prevenir cadas.  EL CONSUMO DE MEDICAMENTOS Y DROGAS DURANTE EL EMBARAZO  Contine tomando las vitaminas apropiadas para esta etapa tal como se le indic. Las vitaminas deben contener un miligramo de cido flico y deben suplementarse con  hierro. Guarde todas las vitaminas fuera del alcance de los nios. La ingestin de slo un par de vitaminas o comprimidos que contengan hierro pueden ocasionar la American Electric Power en un beb o en un nio pequeo.   Evite el uso de [place], inclusive los de venta [place], que no hayan sido prescritos o indicados por el profesional que la asiste. Algunos medicamentos pueden causar problemas fsicos al beb. Utilice los medicamentos de venta libre o de prescripcin para Conservation officer, historic buildings, Health and safety inspector o la [place], segn se lo indique el profesional que lo asiste. No utilice aspirina, ibuprofeno (Motrin, Advil, Nuprin) o naproxeno (Aleve) a menos que el profesional la autorice.   El alcohol se asocia a cierto nmero de defectos del nacimiento, incluido el sndrome de alcoholismo fetal. Debe evitar el consumo de alcohol en cualquiera de sus formas. El cigarrillo causa nacimientos prematuros y bebs de bajo peso al nacer. Las drogas de la calle son muy nocivas para el beb y estn absolutamente prohibidas. Un beb que nace de Safeco Corporation, ser adicto al nacer. Ese beb tendr los mismos sntomas de abstinencia que un adulto.   Infrmele al profesional si consume alguna droga.  SOLICITE ATENCIN MDICA SI: Tiene alguna preocupacin Solicitor. Es mejor que llame para formular las preguntas si no puede esperar hasta la prxima visita, que sentirse preocupada por ellas.  [place] o no cul es el sexo de su beb. Si es un varn, ste es el momento de pensar acerca de la circuncisin. La circuncisin es la extirpacin del prepucio. Esta es la piel que cubre el extremo sensible del pene. No hay un motivo mdico que lo justifique. Generalmente la decisin se toma segn lo que sea popular en ese momento, o se basa en creencias religiosas. Podr conversar estos temas con el profesional que la asiste. SOLICITE ATENCIN MDICA DE INMEDIATO SI:  La temperatura oral se eleva  sin motivo por encima de 5 F (38.9 C) o segn le indique el profesional que la asiste.   Tiene una prdida de lquido por la vagina (canal de parto). Si sospecha una ruptura de las [place], tmese la temperatura y llame al profesional para informarlo sobre esto.   Observa unas pequeas manchas, una hemorragia vaginal o elimina cogulos. Avsele al profesional acerca de la cantidad y de cuntos apsitos est utilizando.   Presenta un olor desagradable en la secrecin vaginal y observa un cambio en el color, de transparente a blanco.   Ha vomitado durante ms de 24 horas.   Presenta escalofros o fiebre.   Comienza a sentir falta de [place].   Siente ardor al [place].   [place] o sube ms de 900 g (ms de 2 libras), o segn lo indicado por el profesional que la asiste. Observa que sbitamente se le Franklin Resources, las manos, los pies o las  piernas.   Presenta dolor abdominal. Las molestias en el ligamento redondo son Neomia Dear causa benigna (no cancerosa) frecuente de Engineer, mining abdominal durante el Psychiatrist, pero el profesional que la asiste deber evaluarlo.   Presenta dolor de cabeza intenso que no se [place].   Si no siente los movimientos del beb durante ms de tres horas. Si piensa que el beb no se mueve tanto como lo haca habitualmente, coma algo que Psychologist, clinical y Target Corporation lado izquierdo durante [place]. El beb debe moverse al menos 4  5 veces por hora. Comunquese inmediatamente si el beb se mueve menos que lo indicado.   Se cae, se ve involucrada en un accidente automovilstico o sufre algn tipo de traumatismo.   En su hogar hay violencia mental o fsica.  Document Released: 07/19/2005 Document Revised: 06/21/2011 [facility] Patient Information 2012 [place], [place]. Amamantar al beb (Breastfeeding) LOS BENEFICIOS DE AMAMANTAR Para el beb  La primera leche (calostro ) ayuda al mejor funcionamiento del sistema digestivo del beb.   La leche tiene anticuerpos que  provienen de la madre y que ayudan a prevenir las infecciones en el beb.   Hay una menor incidencia de asma, enfermedades alrgicas y SMSI (sndrome de muerte sbita nfantil).   Los nutrientes que contiene la [place] materna son mejores que las frmulas para el bibern y favorecen el desarrollo cerebral.   Los bebs amamantados sufren menos gases, clicos y constipacin.  Para la mam  La lactancia materna favorece el desarrollo de un vnculo muy especial entre la madre y el beb.   Es ms conveniente, siempre disponible a la Optician, dispensing y ms econmica que la CHS Inc.   Consume caloras en la madre y la ayuda a perder el peso ganado durante el [place].   Favorece la contraccin del tero a su tamao normal, de manera ms rpida y Berkshire Hathaway las hemorragias luego del [place].   Las M.D.C. Holdings que amamantan tienen menor riesgo de Geophysical data processor de mama.  AMAMNTELO CON FRECUENCIA  Un beb sano, nacido a trmino, puede amamantarse con tanta frecuencia como cada hora, o espaciar las comidas cada tres horas.   Esta frecuencia variar de un beb a otro. Observe al beb cuando manifieste signos de hambre, antes que regirse por el reloj.   Amamntelo tan seguido como el beb lo solicite, o cuando usted sienta la necesidad de Paramedic sus [place].   Despierte al beb si han pasado 3  4 horas desde la ltima comida.   El amamantamiento frecuente la ayudar a producir ms [place] y a Education officer, community de Engineer, mining en los pezones e hinchazn de las [place].  LA POSICIN DEL BEB PARA AMAMANTARLO  Ya sea que se encuentre acostada o sentada, asegrese que el abdomen del beb enfrente el suyo.   Sostenga la mama con el pulgar por arriba y el resto de los dedos por debajo. Asegrese que sus dedos se encuentren lejos del pezn y de la boca del beb.   Toque suavemente los labios del beb y la mejilla ms cercana a la mama con el dedo o el pezn.   Cuando la boca del beb se abra lo  suficiente, introduzca el pezn y la zona oscura que lo rodea tanto como le sea posible dentro de la boca.   Coloque a beb cerca suyo de modo que su nariz y mejillas toquen las mamas al Texas Instruments.  LAS COMIDAS  La duracin de cada comida vara de un beb a otro y de [place] comida  a otra.   El beb debe succionar alrededor American Financial o tres minutos para que le llegue [place]. Esto se denomina "bajada". Por este motivo, permita que el nio se alimente en cada mama todo lo que desee. Terminar de mamar cuando haya recibido la cantidad [place] de nutrientes.   Para detener la succin coloque su dedo en la comisura de la boca del nio y Midwife entre sus encas antes de quitarle la mama de la boca. Esto la ayudar a English as a second language teacher.  REDUCIR LA CONGESTIN DE LAS MAMAS  Durante la primera semana despus del parto, usted puede experimentar Monsanto Company. Cuando las mamas estn congestionadas, se sienten calientes, llenas y molestas al tacto. Puede reducir la congestin si:   Lo amamanta frecuentemente, cada 2-3 horas. Las mams que CDW Corporation pronto y con frecuencia tienen menos problemas de [place].   Coloque bolsas fras livianas entre cada [place]. Esto ayuda a Building services engineer. Envuelva las bolsas de hielo en una toalla liviana para proteger su piel.   Aplique compresas hmedas calientes Wm. Wrigley Jr. Company durante 5 a 10 minutos antes de amamantar al McGraw-Hill. Esto aumenta la circulacin y [place] a que la [place].   [place]ee suavemente la mama antes y Psychologist, sport and exercise.   Asegrese que el nio vaca al menos una mama antes de cambiar de lado.   Use un sacaleche para vaciar la mama si el beb se duerme o no se alimenta bien. Tambin podr Phelps Dodge con esta bomba si tiene que volver al trabajo o siente que las mamas estn congestionadas.   Evite los biberones, chupetes o complementar la alimentacin con agua o jugos en lugar de la [place].   Verifique que el  beb se encuentra en la posicin correcta mientras lo alimenta.   Evite el cansancio, el estrs y la anemia   Use un soutien que sostenga bien sus mamas y evite los que tienen aro.   Consuma una dieta balanceada y beba lquidos en cantidad.  Si sigue estas indicaciones, la congestin debe mejorar en 24 a 48 horas. Si an tiene dificultades, consulte a Barista. TENDR SUFICIENTE LECHE MI BEB? Algunas veces las madres se preocupan acerca de si sus bebs tendrn la leche suficiente. Puede asegurarse que el beb tiene la leche suficiente si:  El beb succiona y escucha que traga activamente.   El nio se alimenta al menos 8 a 12 veces en 24 horas. Alimntelo hasta que se desprenda por sus propios medios o se quede dormido en la primera mama (al menos durante 10 a 20 minutos), luego ofrzcale el otro lado.   El beb moja 5 a 6 paales descartables (6 a 8 paales de tela) en 24 horas cuando tiene 5  6 das de vida.   Tiene al menos 2-3 deposiciones todos los Becton, Dickinson and Company primeros meses. La leche materna es todo el alimento que el beb necesita. No es necesario que el nio ingiera agua o preparados de bibern. De hecho, para ayudar a que sus mamas produzcan ms Edison, lo mejor es no darle al beb suplementos durante las primeras semanas.   La materia fecal debe ser blanda y [place].   El beb debe aumentar 112 a 196 g por semana.  CUDESE Cuide sus mamas del siguiente modo:  Bese o dchese diariamente.   No lave sus pezones con jabn.   Comience a amamantar del lado izquierdo en Neomia Dear comida y del lado  derecho en la siguiente.   Notar que H[employer]R Block suministro de [place] a los 2 a 5 [address]  [address] despus del [place]. Puede sentir algunas molestias por la congestin, lo que hace que sus mamas estn duras y sensibles. La congestin disminuye en 24 a 48 horas. Mientras tanto, aplique toallas hmedas calientes durante 5 a 10 minutos antes de amamantar. Un masaje suave y la extraccin de  un poco de leche antes de Museum/gallery exhibitions officer ablandarn las mamas y har ms fcil que el beb se agarre. Use un buen sostn y seque al aire los pezones durante 10 a 15 minutos luego de cada alimentacin.   Solo utilice apsitos de algodn.   Utilice lanolina WESCO International pezones luego de [place]. No necesita lavarlos luego de alimentar al McGraw-Hill.  Cudese del siguiente modo:   Consuma alimentos bien balanceados y refrigerios nutritivos.   Dixie Dials, jugos de fruta y agua para Warehouse manager sed (alrededor de 8 vasos por Futures trader).   Descanse lo suficiente.   Aumente la ingesta de calcio en la dieta (1200mg /da).   Evite los alimentos que usted nota que puedan afectar al beb.  SOLICITE ATENCIN MDICA SI:  Tiene preguntas que formular o dificultades con la alimentacin a pecho.   Necesita ayuda.   Observa una zona dura, roja y que le duele en la zona de la mama, y se acompaa de fiebre de 100.5 F (38.1 C) o ms.   El beb est muy somnoliento como para alimentarse bien o tiene problemas para dormir.   El beb moja menos de 6 paales por da, a partir de los [address] de [place].   La piel del beb o la parte blanca de sus ojos est ms amarilla de lo que estaba en el hospital.   Se siente deprimida.  Document Released: 10/09/2005 Document Revised: 06/21/2011 [facility] Patient Information 2012 [place], [place].

## 2011-09-11 ENCOUNTER — Ambulatory Visit (INDEPENDENT_AMBULATORY_CARE_PROVIDER_SITE_OTHER): Payer: Medicaid Other | Admitting: Physician Assistant

## 2011-09-11 DIAGNOSIS — Z3480 Encounter for supervision of other normal pregnancy, unspecified trimester: Secondary | ICD-10-CM

## 2011-09-11 DIAGNOSIS — O09899 Supervision of other high risk pregnancies, unspecified trimester: Secondary | ICD-10-CM

## 2011-09-11 DIAGNOSIS — K8020 Calculus of gallbladder without cholecystitis without obstruction: Secondary | ICD-10-CM

## 2011-09-11 LAB — POCT URINALYSIS DIP (DEVICE)
Nitrite: NEGATIVE
Protein, ur: NEGATIVE mg/dL
Urobilinogen, UA: 1 mg/dL (ref 0.0–1.0)
pH: 6.5 (ref 5.0–8.0)

## 2011-09-11 NOTE — Progress Notes (Signed)
+   FM daily, no complaints. No labor s/s. NST at next visit if not delivered. Will scheduled IOL at 41 weeks.

## 2011-09-11 NOTE — Patient Instructions (Signed)
Amamantar al beb (Breastfeeding) LOS BENEFICIOS DE AMAMANTAR Para el beb  La primera leche (calostro ) ayuda al mejor funcionamiento del sistema digestivo del beb.   La leche tiene anticuerpos que provienen de la madre y que ayudan a prevenir las infecciones en el beb.   Hay una menor incidencia de asma, enfermedades alrgicas y SMSI (sndrome de muerte sbita nfantil).   Los nutrientes que contiene la leche materna son mejores que las frmulas para el bibern y favorecen el desarrollo cerebral.   Los bebs amamantados sufren menos gases, clicos y constipacin.  Para la mam  La lactancia materna favorece el desarrollo de un vnculo muy especial entre la madre y el beb.   Es ms conveniente, siempre disponible a la temperatura adecuada y ms econmica que la leche maternizada.   Consume caloras en la madre y la ayuda a perder el peso ganado durante el embarazo.   Favorece la contraccin del tero a su tamao normal, de manera ms rpida y disminuye las hemorragias luego del parto.   Las madres que amamantan tienen menor riesgo de desarrollar cncer de mama.  AMAMNTELO CON FRECUENCIA  Un beb sano, nacido a trmino, puede amamantarse con tanta frecuencia como cada hora, o espaciar las comidas cada tres horas.   Esta frecuencia variar de un beb a otro. Observe al beb cuando manifieste signos de hambre, antes que regirse por el reloj.   Amamntelo tan seguido como el beb lo solicite, o cuando usted sienta la necesidad de aliviar sus mamas.   Despierte al beb si han pasado 3  4 horas desde la ltima comida.   El amamantamiento frecuente la ayudar a producir ms leche y a prevenir problemas de dolor en los pezones e hinchazn de las mamas.  LA POSICIN DEL BEB PARA AMAMANTARLO  Ya sea que se encuentre acostada o sentada, asegrese que el abdomen del beb enfrente el suyo.   Sostenga la mama con el pulgar por arriba y el resto de los dedos por debajo. Asegrese que  sus dedos se encuentren lejos del pezn y de la boca del beb.   Toque suavemente los labios del beb y la mejilla ms cercana a la mama con el dedo o el pezn.   Cuando la boca del beb se abra lo suficiente, introduzca el pezn y la zona oscura que lo rodea tanto como le sea posible dentro de la boca.   Coloque a beb cerca suyo de modo que su nariz y mejillas toquen las mamas al mamar.  LAS COMIDAS  La duracin de cada comida vara de un beb a otro y de una comida a otra.   El beb debe succionar alrededor de dos o tres minutos para que le llegue leche. Esto se denomina "bajada". Por este motivo, permita que el nio se alimente en cada mama todo lo que desee. Terminar de mamar cuando haya recibido la cantidad adecuada de nutrientes.   Para detener la succin coloque su dedo en la comisura de la boca del nio y deslcelo entre sus encas antes de quitarle la mama de la boca. Esto la ayudar a evitar el dolor en los pezones.  REDUCIR LA CONGESTIN DE LAS MAMAS  Durante la primera semana despus del parto, usted puede experimentar congestin en las mamas. Cuando las mamas estn congestionadas, se sienten calientes, llenas y molestas al tacto. Puede reducir la congestin si:   Lo amamanta frecuentemente, cada 2-3 horas. Las mams que amamantan pronto y con frecuencia tienen menos problemas   de congestin.   Coloque bolsas fras livianas entre cada mamada. Esto ayuda a reducir la hinchazn. Envuelva las bolsas de hielo en una toalla liviana para proteger su piel.   Aplique compresas hmedas calientes sobre la mama durante 5 a 10 minutos antes de amamantar al nio. Esto aumenta la circulacin y ayuda a que la leche fluya.   Masajee suavemente la mama antes y durante la alimentacin.   Asegrese que el nio vaca al menos una mama antes de cambiar de lado.   Use un sacaleche para vaciar la mama si el beb se duerme o no se alimenta bien. Tambin podr quitarse la leche con esta bomba si  tiene que volver al trabajo o siente que las mamas estn congestionadas.   Evite los biberones, chupetes o complementar la alimentacin con agua o jugos en lugar de la leche materna.   Verifique que el beb se encuentra en la posicin correcta mientras lo alimenta.   Evite el cansancio, el estrs y la anemia   Use un soutien que sostenga bien sus mamas y evite los que tienen aro.   Consuma una dieta balanceada y beba lquidos en cantidad.  Si sigue estas indicaciones, la congestin debe mejorar en 24 a 48 horas. Si an tiene dificultades, consulte a su asesor en lactancia. TENDR SUFICIENTE LECHE MI BEB? Algunas veces las madres se preocupan acerca de si sus bebs tendrn la leche suficiente. Puede asegurarse que el beb tiene la leche suficiente si:  El beb succiona y escucha que traga activamente.   El nio se alimenta al menos 8 a 12 veces en 24 horas. Alimntelo hasta que se desprenda por sus propios medios o se quede dormido en la primera mama (al menos durante 10 a 20 minutos), luego ofrzcale el otro lado.   El beb moja 5 a 6 paales descartables (6 a 8 paales de tela) en 24 horas cuando tiene 5  6 das de vida.   Tiene al menos 2-3 deposiciones todos los das en los primeros meses. La leche materna es todo el alimento que el beb necesita. No es necesario que el nio ingiera agua o preparados de bibern. De hecho, para ayudar a que sus mamas produzcan ms leche, lo mejor es no darle al beb suplementos durante las primeras semanas.   La materia fecal debe ser blanda y amarillenta.   El beb debe aumentar 112 a 196 g por semana.  CUDESE Cuide sus mamas del siguiente modo:  Bese o dchese diariamente.   No lave sus pezones con jabn.   Comience a amamantar del lado izquierdo en una comida y del lado derecho en la siguiente.   Notar que aumenta el suministro de leche a los 2 a 5 das despus del parto. Puede sentir algunas molestias por la congestin, lo que hace que  sus mamas estn duras y sensibles. La congestin disminuye en 24 a 48 horas. Mientras tanto, aplique toallas hmedas calientes durante 5 a 10 minutos antes de amamantar. Un masaje suave y la extraccin de un poco de leche antes de amamantar ablandarn las mamas y har ms fcil que el beb se agarre. Use un buen sostn y seque al aire los pezones durante 10 a 15 minutos luego de cada alimentacin.   Solo utilice apsitos de algodn.   Utilice lanolina pura sobre los pezones luego de amamantar. No necesita lavarlos luego de alimentar al nio.  Cudese del siguiente modo:   Consuma alimentos bien balanceados y refrigerios nutritivos.     Beba leche, jugos de fruta y agua para satisfacer la sed (alrededor de 8 vasos por da).   Descanse lo suficiente.   Aumente la ingesta de calcio en la dieta (1200mg/da).   Evite los alimentos que usted nota que puedan afectar al beb.  SOLICITE ATENCIN MDICA SI:  Tiene preguntas que formular o dificultades con la alimentacin a pecho.   Necesita ayuda.   Observa una zona dura, roja y que le duele en la zona de la mama, y se acompaa de fiebre de 100.5 F (38.1 C) o ms.   El beb est muy somnoliento como para alimentarse bien o tiene problemas para dormir.   El beb moja menos de 6 paales por da, a partir de los 5 das de vida.   La piel del beb o la parte blanca de sus ojos est ms amarilla de lo que estaba en el hospital.   Se siente deprimida.  Document Released: 10/09/2005 Document Revised: 06/21/2011 [employer] Patient Information 2012 [employer]. 

## 2011-09-11 NOTE — Progress Notes (Signed)
Pt states having pain in both knees down. Rates pain a 7. Pelvic pain. No vaginal discharge. Pulse 72. Used interpreter Delorise Royals.

## 2011-09-13 ENCOUNTER — Inpatient Hospital Stay (HOSPITAL_COMMUNITY)
Admission: AD | Admit: 2011-09-13 | Discharge: 2011-09-14 | DRG: 775 | Disposition: A | Discharge status: Home / Self Care | Payer: Medicaid Other | Source: Ambulatory Visit | Attending: Family Medicine | Admitting: Family Medicine

## 2011-09-13 ENCOUNTER — Encounter (HOSPITAL_COMMUNITY): Payer: Self-pay | Admitting: *Deleted

## 2011-09-13 MED ORDER — CEPHALEXIN 500 MG PO CAPS
500.0000 mg | ORAL_CAPSULE | Freq: Four times a day (QID) | ORAL | Status: DC
  Administered 2011-09-13 – 2011-09-14 (×5): 500 mg via ORAL
  Filled 2011-09-13 (×6): qty 1

## 2011-09-13 MED ORDER — SIMETHICONE 80 MG PO CHEW: 80.0000 mg | CHEWABLE_TABLET | ORAL | Status: DC | PRN

## 2011-09-13 MED ORDER — DIPHENHYDRAMINE HCL 25 MG PO CAPS: 25.0000 mg | ORAL_CAPSULE | Freq: Four times a day (QID) | ORAL | Status: DC | PRN

## 2011-09-13 MED ORDER — WITCH HAZEL-GLYCERIN EX PADS: 1.0000 "application " | MEDICATED_PAD | CUTANEOUS | Status: DC | PRN

## 2011-09-13 MED ORDER — LANOLIN HYDROUS EX OINT: TOPICAL_OINTMENT | CUTANEOUS | Status: DC | PRN

## 2011-09-13 MED ORDER — ONDANSETRON HCL 4 MG/2ML IJ SOLN: 4.0000 mg | INTRAMUSCULAR | Status: DC | PRN

## 2011-09-13 MED ORDER — IBUPROFEN 600 MG PO TABS
600.0000 mg | ORAL_TABLET | Freq: Four times a day (QID) | ORAL | Status: DC
  Administered 2011-09-13 – 2011-09-14 (×5): 600 mg via ORAL
  Filled 2011-09-13 (×5): qty 1

## 2011-09-13 MED ORDER — ZOLPIDEM TARTRATE 5 MG PO TABS: 5.0000 mg | ORAL_TABLET | Freq: Every evening | ORAL | Status: DC | PRN

## 2011-09-13 MED ORDER — ONDANSETRON HCL 4 MG PO TABS: 4.0000 mg | ORAL_TABLET | ORAL | Status: DC | PRN

## 2011-09-13 MED ORDER — TETANUS-DIPHTH-ACELL PERTUSSIS 5-2.5-18.5 LF-MCG/0.5 IM SUSP: 0.5000 mL | Freq: Once | INTRAMUSCULAR | Status: DC

## 2011-09-13 MED ORDER — SENNOSIDES-DOCUSATE SODIUM 8.6-50 MG PO TABS
2.0000 | ORAL_TABLET | Freq: Every day | ORAL | Status: DC
  Administered 2011-09-13: 2 via ORAL

## 2011-09-13 MED ORDER — OXYCODONE-ACETAMINOPHEN 5-325 MG PO TABS: 1.0000 | ORAL_TABLET | ORAL | Status: DC | PRN

## 2011-09-13 MED ORDER — BENZOCAINE-MENTHOL 20-0.5 % EX AERO: 1.0000 "application " | INHALATION_SPRAY | CUTANEOUS | Status: DC | PRN

## 2011-09-13 MED ORDER — PRENATAL PLUS 27-1 MG PO TABS
1.0000 | ORAL_TABLET | Freq: Every day | ORAL | Status: DC
  Administered 2011-09-13 – 2011-09-14 (×2): 1 via ORAL
  Filled 2011-09-13 (×2): qty 1

## 2011-09-13 MED ORDER — DIBUCAINE 1 % RE OINT: 1.0000 "application " | TOPICAL_OINTMENT | RECTAL | Status: DC | PRN

## 2011-09-13 NOTE — Progress Notes (Signed)
Pt came into MAU fully dilated. FHr noted at 143. Resident paged and present for the delivery at 0043.

## 2011-09-13 NOTE — H&P (Signed)
Diana Robertson is a 20 y.o. female presenting for labor. Maternal Medical History:  Reason for admission: Reason for admission: contractions.  Presented to MAU in active labor, complete and pushing.   Contractions: Frequency: regular.   Perceived severity is strong.    Prenatal complications: no prenatal complications   OB History    Grav Para Term Preterm Abortions TAB SAB Ect Mult Living   2 1 1       1      Past Medical History  Diagnosis Date  . No pertinent past medical history   . Gallstones    Past Surgical History  Procedure Date  . No past surgeries    Family History: family history is not on file. Social History:  reports that she has never smoked. She does not have any smokeless tobacco history on file. She reports that she does not drink alcohol or use illicit drugs.  Review of Systems  Unable to perform ROS: severity of pain      unknown if currently breastfeeding. Maternal Exam:  Uterine Assessment: Contraction strength is firm.  Contraction frequency is regular.   Abdomen: Patient reports no abdominal tenderness. Fetal presentation: vertex  Introitus: Normal vulva. Normal vagina.  Pelvis: adequate for delivery.   Cervix: Cervix evaluated by digital exam.     Physical Exam  Nursing note and vitals reviewed. Constitutional: She is oriented to person, place, and time. She appears well-developed and well-nourished. She appears distressed (actively laboring).  Cardiovascular: Normal rate.   Respiratory: Effort normal.  Musculoskeletal: Normal range of motion.  Neurological: She is alert and oriented to person, place, and time.  Skin: Skin is warm and dry.  Psychiatric: She has a normal mood and affect.    Prenatal labs: ABO, Rh: A/Positive/-- (08/15 0000) Antibody: Negative (08/15 0000) Rubella: Immune (08/15 0000) RPR: Nonreactive (09/05 0000)  HBsAg: Negative (08/15 0000)  HIV: Non-reactive (08/15 0000)  GBS: Negative (10/24 0000)    Assessment/Plan: 20 y.o. G2P1001 at [redacted]w[redacted]d Precip delivery in MAU, couplet stable Admit to Mother-Baby, routine PP care   Diana Robertson 09/13/2011, 1:00 AM

## 2011-09-13 NOTE — Progress Notes (Signed)
UR chart review completed.  

## 2011-09-14 MED ORDER — MEDROXYPROGESTERONE ACETATE 150 MG/ML IM SUSP
150.0000 mg | Freq: Once | INTRAMUSCULAR | Status: AC
  Administered 2011-09-14: 150 mg via INTRAMUSCULAR
  Filled 2011-09-14: qty 1

## 2011-09-14 NOTE — Discharge Summary (Signed)
Obstetric Discharge Summary Reason for Admission: onset of labor Prenatal Procedures: ultrasound Intrapartum Procedures: precipitous delivery in MAU Postpartum Procedures: none Complications-Operative and Postpartum: none Hemoglobin  Date Value Range Status  07/07/2011 10.9* 12.0-15.0 (g/dL) Final     HCT  Date Value Range Status  07/07/2011 32.0* 36.0-46.0 (%) Final    Discharge Diagnoses: Term Pregnancy-delivered  Discharge Information: Date: 09/14/2011 Activity: pelvic rest Diet: routine Medications: PNV and Ibuprofen Depoprovera pre discharge Condition: stable Instructions: refer to practice specific booklet Discharge to: home Follow-up Information    Follow up with [facility]. Make an appointment in 6 weeks.   Contact information:   [address] [employer] [place] [address]          Newborn Data: Live born female  Birth Weight: 7 lb 2.3 oz (3240 g) APGAR: 9, 9  Home with mother.  Diana Robertson 09/14/2011, 7:23 AM

## 2011-09-14 NOTE — Progress Notes (Signed)
Subjective  No concerns. Breastfeeding, nipples slightly sore.  Lochia tapering. Afterpains mild.  Ambulatory without orthostatic sx. Plans condoms for contraception until. Bonding well. Has help at home. Desires early discharge. Plans outside circumcision.  Objective  Filed Vitals:   09/14/11 0625  BP: 104/70  Pulse: 74  Temp: 98.4 F (36.9 C)  Resp: 18    Breasts: soft, filling  ZOX:WRUE, nontender, fundus firm, well below umbilicus  Ext: nontender calves, trace dependent edema    Assessment   G1P1001 PPD#1 s/p uncomplicated NSVD, doing well  Plan  Discharge home.  Meds: PN vitamins Discharge instructions: per booklet, reviewed F/U at [facility] in 6 weeks   Anthonia Monger 09/14/2011 7:15 AM

## 2011-09-22 NOTE — Discharge Summary (Signed)
Attestation of Attending Supervision of Advanced Practitioner: Evaluation and management procedures were performed by the PA/NP/CNM/OB Fellow under my supervision/collaboration. Chart reviewed and agree with management and plan.  Tranae Laramie V 09/22/2011 3:52 AM    

## 2011-10-18 ENCOUNTER — Ambulatory Visit: Payer: Medicaid Other | Admitting: Family

## 2014-08-24 ENCOUNTER — Encounter (HOSPITAL_COMMUNITY): Payer: Self-pay | Admitting: *Deleted

## 2014-10-05 ENCOUNTER — Encounter (HOSPITAL_COMMUNITY): Payer: Self-pay | Admitting: Emergency Medicine

## 2014-10-05 DIAGNOSIS — Z3202 Encounter for pregnancy test, result negative: Secondary | ICD-10-CM | POA: Insufficient documentation

## 2014-10-05 DIAGNOSIS — K8020 Calculus of gallbladder without cholecystitis without obstruction: Secondary | ICD-10-CM | POA: Insufficient documentation

## 2014-10-05 NOTE — ED Notes (Signed)
Pt c/o upper abdominal pain since Thursday with N/V/and a few episodes of diarrhea. Has hx of gallstones.

## 2014-10-06 ENCOUNTER — Encounter (HOSPITAL_COMMUNITY): Payer: Self-pay | Admitting: Emergency Medicine

## 2014-10-06 ENCOUNTER — Emergency Department (HOSPITAL_COMMUNITY)
Admission: EM | Admit: 2014-10-06 | Discharge: 2014-10-06 | Disposition: A | Discharge status: Home / Self Care | Payer: Medicaid Other | Attending: Emergency Medicine | Admitting: Emergency Medicine

## 2014-10-06 ENCOUNTER — Emergency Department (HOSPITAL_COMMUNITY)
Admission: EM | Admit: 2014-10-06 | Discharge: 2014-10-06 | Disposition: A | Discharge status: Home / Self Care | Payer: Self-pay | Attending: Emergency Medicine | Admitting: Emergency Medicine

## 2014-10-06 DIAGNOSIS — K8020 Calculus of gallbladder without cholecystitis without obstruction: Secondary | ICD-10-CM | POA: Insufficient documentation

## 2014-10-06 DIAGNOSIS — R1013 Epigastric pain: Secondary | ICD-10-CM

## 2014-10-06 LAB — CBC WITH DIFFERENTIAL/PLATELET
BASOS ABS: 0 10*3/uL (ref 0.0–0.1)
BASOS ABS: 0 10*3/uL (ref 0.0–0.1)
BASOS PCT: 0 % (ref 0–1)
BASOS PCT: 0 % (ref 0–1)
EOS PCT: 1 % (ref 0–5)
Eosinophils Absolute: 0.1 10*3/uL (ref 0.0–0.7)
Eosinophils Absolute: 0.1 10*3/uL (ref 0.0–0.7)
Eosinophils Relative: 1 % (ref 0–5)
HEMATOCRIT: 40.2 % (ref 36.0–46.0)
HEMATOCRIT: 35.7 % — AB (ref 36.0–46.0)
Hemoglobin: 13.7 g/dL (ref 12.0–15.0)
Hemoglobin: 11.6 g/dL — ABNORMAL LOW (ref 12.0–15.0)
LYMPHS PCT: 46 % (ref 12–46)
LYMPHS PCT: 36 % (ref 12–46)
Lymphs Abs: 4.3 10*3/uL — ABNORMAL HIGH (ref 0.7–4.0)
Lymphs Abs: 2.1 10*3/uL (ref 0.7–4.0)
MCH: 31.9 pg (ref 26.0–34.0)
MCH: 30.5 pg (ref 26.0–34.0)
MCHC: 34.1 g/dL (ref 30.0–36.0)
MCHC: 32.5 g/dL (ref 30.0–36.0)
MCV: 93.7 fL (ref 78.0–100.0)
MCV: 93.9 fL (ref 78.0–100.0)
MONO ABS: 0.5 10*3/uL (ref 0.1–1.0)
MONO ABS: 0.3 10*3/uL (ref 0.1–1.0)
Monocytes Relative: 6 % (ref 3–12)
Monocytes Relative: 5 % (ref 3–12)
NEUTROS ABS: 3.5 10*3/uL (ref 1.7–7.7)
NEUTROS PCT: 58 % (ref 43–77)
Neutro Abs: 4.3 10*3/uL (ref 1.7–7.7)
Neutrophils Relative %: 47 % (ref 43–77)
Platelets: 225 10*3/uL (ref 150–400)
Platelets: 179 10*3/uL (ref 150–400)
RBC: 4.29 MIL/uL (ref 3.87–5.11)
RBC: 3.8 MIL/uL — ABNORMAL LOW (ref 3.87–5.11)
RDW: 12.2 % (ref 11.5–15.5)
RDW: 12.2 % (ref 11.5–15.5)
WBC: 9.2 10*3/uL (ref 4.0–10.5)
WBC: 5.9 10*3/uL (ref 4.0–10.5)

## 2014-10-06 LAB — URINALYSIS, ROUTINE W REFLEX MICROSCOPIC
BILIRUBIN URINE: NEGATIVE
Glucose, UA: NEGATIVE mg/dL
Leukocytes, UA: NEGATIVE
NITRITE: NEGATIVE
PH: 6 (ref 5.0–8.0)
Protein, ur: NEGATIVE mg/dL
Specific Gravity, Urine: >1.03 — ABNORMAL HIGH (ref 1.005–1.030)
Urobilinogen, UA: 0.2 mg/dL (ref 0.0–1.0)

## 2014-10-06 LAB — COMPREHENSIVE METABOLIC PANEL
ALBUMIN: 3.4 g/dL — AB (ref 3.5–5.2)
ALK PHOS: 58 U/L (ref 39–117)
ALT: 9 U/L (ref 0–35)
AST: 13 U/L (ref 0–37)
Anion gap: 10 (ref 5–15)
BUN: 10 mg/dL (ref 6–23)
CHLORIDE: 109 meq/L (ref 96–112)
CO2: 21 meq/L (ref 19–32)
CREATININE: 0.6 mg/dL (ref 0.50–1.10)
Calcium: 8.9 mg/dL (ref 8.4–10.5)
GFR calc Af Amer: >90 mL/min (ref >90)
Glucose, Bld: 92 mg/dL (ref 70–99)
Potassium: 4.1 mEq/L (ref 3.7–5.3)
Sodium: 140 mEq/L (ref 137–147)
Total Bilirubin: 0.3 mg/dL (ref 0.3–1.2)
Total Protein: 6.4 g/dL (ref 6.0–8.3)

## 2014-10-06 LAB — PREGNANCY, URINE: Preg Test, Ur: NEGATIVE

## 2014-10-06 LAB — BASIC METABOLIC PANEL
ANION GAP: 10 (ref 5–15)
BUN: 15 mg/dL (ref 6–23)
CALCIUM: 9.7 mg/dL (ref 8.4–10.5)
CO2: 26 meq/L (ref 19–32)
CREATININE: 0.81 mg/dL (ref 0.50–1.10)
Chloride: 103 mEq/L (ref 96–112)
GFR calc Af Amer: >90 mL/min (ref >90)
GFR calc non Af Amer: >90 mL/min (ref >90)
Glucose, Bld: 106 mg/dL — ABNORMAL HIGH (ref 70–99)
Potassium: 3.7 mEq/L (ref 3.7–5.3)
Sodium: 139 mEq/L (ref 137–147)

## 2014-10-06 LAB — HEPATIC FUNCTION PANEL
ALBUMIN: 4 g/dL (ref 3.5–5.2)
ALK PHOS: 72 U/L (ref 39–117)
ALT: 11 U/L (ref 0–35)
AST: 16 U/L (ref 0–37)
BILIRUBIN TOTAL: 0.2 mg/dL — AB (ref 0.3–1.2)
Bilirubin, Direct: <0.2 mg/dL (ref 0.0–0.3)
Total Protein: 7.8 g/dL (ref 6.0–8.3)

## 2014-10-06 LAB — LIPASE, BLOOD
LIPASE: 17 U/L (ref 11–59)
Lipase: 21 U/L (ref 11–59)

## 2014-10-06 LAB — URINE MICROSCOPIC-ADD ON

## 2014-10-06 MED ORDER — ONDANSETRON HCL 4 MG PO TABS: 4.0000 mg | ORAL_TABLET | Freq: Four times a day (QID) | ORAL | Status: DC

## 2014-10-06 MED ORDER — HYDROCODONE-ACETAMINOPHEN 5-325 MG PO TABS: 1.0000 | ORAL_TABLET | ORAL | Status: DC | PRN

## 2014-10-06 MED ORDER — SODIUM CHLORIDE 0.9 % IV BOLUS (SEPSIS)
1000.0000 mL | Freq: Once | INTRAVENOUS | Status: AC
  Administered 2014-10-06: 1000 mL via INTRAVENOUS

## 2014-10-06 MED ORDER — ONDANSETRON HCL 4 MG/2ML IJ SOLN
4.0000 mg | Freq: Once | INTRAMUSCULAR | Status: AC
  Administered 2014-10-06: 4 mg via INTRAVENOUS
  Filled 2014-10-06: qty 2

## 2014-10-06 MED ORDER — FENTANYL CITRATE 0.05 MG/ML IJ SOLN
50.0000 ug | Freq: Once | INTRAMUSCULAR | Status: AC
  Administered 2014-10-06: 50 ug via INTRAVENOUS
  Filled 2014-10-06: qty 2

## 2014-10-06 MED ORDER — FAMOTIDINE IN NACL 20-0.9 MG/50ML-% IV SOLN
20.0000 mg | Freq: Once | INTRAVENOUS | Status: AC
  Administered 2014-10-06: 20 mg via INTRAVENOUS
  Filled 2014-10-06: qty 50

## 2014-10-06 NOTE — ED Notes (Signed)
Was seen here yesterday -- dx with gallstones, left before treatment finished, "Had to pick up child from school" . Vomiting x 2, continues to have pain in epigastric area.

## 2014-10-06 NOTE — Discharge Instructions (Signed)
Colelitiasis (Cholelithiasis) La colelitiasis (tambin llamada clculos en la vescula) es una enfermedad en la que se forman piedras en la vescula. La vescula es un rgano que almacena la bilis que se forma en el hgado y que ayuda a digerir grasas. Los clculos comienzan como pequeos cristales y lentamente se transforman en piedras. El dolor en la vescula ocurre cuando se producen espasmos y los clculos obstruyen el conducto. El dolor tambin se produce cuando una piedra sale por el conducto.  FACTORES DE RIESGO  Ser mujer.   Tener embarazos mltiples. Algunas veces los mdicos aconsejan extirpar los clculos biliares antes de futuros embarazos.   Ser obeso.  Dietas que incluyan comidas fritas y grasas.   Ser mayor de 60 aos y el aumento de la edad.   El uso prolongado de medicamentos que contengan hormonas femeninas.   Tener diabetes mellitus.   Prdida rpida de peso.   Historia familiar de clculos (herencia).  SNTOMAS  Nuseas.   Vmitos.  Dolor abdominal.   Piel amarilla (ictericia)   Dolor sbito. Puede persistir desde algunos minutos hasta algunas horas.  Fiebre.   Sensibilidad al tacto. En algunos casos, cuando los clculos biliares no se mueven hacia el conducto biliar, las personas no sienten dolor ni presentan sntomas. Estos se denominan clculos "silenciosos".  TRATAMIENTO Los clculos silenciosos no requieren tratamiento. En los casos graves, podr ser necesaria una ciruga de urgencia. Las opciones de tratamiento son:  Ciruga para extirpar la vescula. Es el tratamiento ms frecuente.  Medicamentos. No siempre dan resultado y pueden demorar entre 6 y 12 meses o ms en hacer efecto.  Tratamiento con ondas de choque (litotricia biliar extracorporal). En este tratamiento, una mquina de ultrasonido enva ondas de choque a la vescula para destruir los clculos en pequeos fragmentos que luego podrn pasar a los intestinos o ser disueltas  con medicamentos. INSTRUCCIONES PARA EL CUIDADO EN EL HOGAR   Slo tome medicamentos de venta libre o recetados para calmar el dolor, el malestar o bajar la fiebre, segn las indicaciones de su mdico.   Siga una dieta baja en grasas hasta que su mdico lo vea nuevamente. Las grasas hacen que la vescula se contraiga, lo que puede producir dolor.   Concurra a las consultas de control con su mdico segn las indicaciones. Los ataques casi siempre son recurrentes y generalmente habr que someterse a una ciruga como tratamiento permanente.  SOLICITE ATENCIN MDICA DE INMEDIATO SI:   El dolor aumenta y no puede controlarlo con los medicamentos.   Tiene fiebre o sntomas persistentes durante ms de 2 - 3 das.   Tiene fiebre y los sntomas empeoran repentinamente.   Tiene nuseas o vmitos persistentes.  ASEGRESE DE QUE:   Comprende estas instrucciones.  Controlar su afeccin.  Recibir ayuda de inmediato si no mejora o si empeora. Document Released: 07/26/2006 Document Revised: 06/11/2013 Exit[employer] Patient Information 2015 [employer]. This information is not intended to replace advice given to you by your health care provider. Make sure you discuss any questions you have with your health care provider.  

## 2014-10-06 NOTE — ED Provider Notes (Signed)
CSN: [id number]     Arrival date & time 10/06/14  [id number] History   First MD Initiated Contact with Patient 10/06/14 [phone]     Chief Complaint  Patient presents with  . Cholelithiasis     (Consider location/radiation/quality/duration/timing/severity/associated sxs/prior Treatment) HPI  Predominately spanish speaking  Patient to the ER for evaluation of her abdominal pains. She has been having pain for greater than 5 days in the epigastric and Left upper quadrant region, she has had two episodes of vomiting and a little bit of nausea. The pain radiates into her back. It is described as sharp and pressure. She has not had any vomiting yesterday or today. She had diarrhea last week but none since then She was diagnosed with gallstones 5 years ago but she did not follow-up.   [facility] and left prematurely. The patients significant other says that the wait was too long and they didn't see the doctor for hours. That thought that they were done. They have come back today earlier as they "have more time to wait for the doctor".  Past Medical History  Diagnosis Date  . No pertinent past medical history   . Gallstones    Past Surgical History  Procedure Laterality Date  . No past surgeries     No family history on file. History  Substance Use Topics  . Smoking status: Never Smoker   . Smokeless tobacco: Not on file  . Alcohol Use: No   OB History    Gravida Para Term Preterm AB TAB SAB Ectopic Multiple Living   2 2 2       2      Review of Systems  10 Systems reviewed and are negative for acute change except as noted in the HPI.    Allergies  Review of patient's allergies indicates no known allergies.  Home Medications   Prior to Admission medications   Medication Sig Start Date End Date Taking? Authorizing Provider  HYDROcodone-acetaminophen (NORCO/VICODIN) 5-325 MG per tablet Take 1 tablet by mouth every 4 (four) hours as needed. 10/06/14   Teletha Petrea Irine SealG  Bela Bonaparte, PA-C  ondansetron (ZOFRAN) 4 MG tablet Take 1 tablet (4 mg total) by mouth every 6 (six) hours. 10/06/14   Jordon Bourquin Irine SealG Karnisha Lefebre, PA-C   BP 105/69 mmHg  Pulse 73  Temp(Src) 98.6 F (37 C) (Oral)  Resp 18  Wt 152 lb (68.947 kg)  SpO2 100% Physical Exam  Constitutional: She appears well-developed and well-nourished. No distress.  HENT:  Head: Normocephalic and atraumatic.  Eyes: Pupils are equal, round, and reactive to light.  Neck: Normal range of motion. Neck supple.  Cardiovascular: Normal rate and regular rhythm.   Pulmonary/Chest: Effort normal.  Abdominal: Soft. She exhibits no distension. Bowel sounds are increased. There is tenderness in the epigastric area and left upper quadrant. There is no rigidity, no rebound, no guarding and no CVA tenderness.  Neurological: She is alert.  Skin: Skin is warm and dry.  Nursing note and vitals reviewed.   ED Course  Procedures (including critical care time) Labs Review Labs Reviewed  CBC WITH DIFFERENTIAL - Abnormal; Notable for the following:    RBC 3.80 (*)    Hemoglobin 11.6 (*)    HCT 35.7 (*)    All other components within normal limits  COMPREHENSIVE METABOLIC PANEL - Abnormal; Notable for the following:    Albumin 3.4 (*)    All other components within normal limits  LIPASE, BLOOD  Imaging Review No results found.   EKG Interpretation None      MDM   Final diagnoses:  Calculus of gallbladder without cholecystitis without obstruction    Patients labs are WNL and have not worsened from yesterday. She does not appear to be in significant pain and the discomfort that she does have improves easily with pain medications. Neg urine preg yesterday at [facility]  I discussed the plan with the pt and her spouse, the need for Surgery Follow-up. She is not vomiting,  Febrile, having diarrhea. Her pain is easily controlled.  23 y.o.Diana Robertson's evaluation in the Emergency Department is complete. It has been  determined that no acute conditions requiring further emergency intervention are present at this time. The patient/guardian have been advised of the diagnosis and plan. We have discussed signs and symptoms that warrant return to the ED, such as changes or worsening in symptoms.  Vital signs are stable at discharge. Filed Vitals:   10/06/14 0911  BP: 105/69  Pulse: 73  Temp: 98.6 F (37 C)  Resp: 18    Patient/guardian has voiced understanding and agreed to follow-up with the PCP or specialist.     Dorthula Matasiffany G Kamber Vignola, PA-C 10/06/14 1025  Mirian MoMatthew Gentry, MD 10/06/14 1432

## 2014-10-06 NOTE — ED Provider Notes (Signed)
CSN: [id number]     Arrival date & time 10/05/14  2151 History   First MD Initiated Contact with Patient 10/06/14 0214     Chief Complaint  Patient presents with  . Abdominal Pain     (Consider location/radiation/quality/duration/timing/severity/associated sxs/prior Treatment) HPI  Patient states she's been having abdominal pain off and on that started 5 days ago. She states the pain lasted about 10 minutes. She states the pain is in her epigastric area and radiates into her back. She describes it as a squeezing pain. She has had nausea and vomiting twice yesterday but no vomiting today. She had diarrhea the day before her pain started but not since. She states bending over as opposed to laying flat makes the pain feel better, any type of food makes the pain worse. She states she had this pain before about 5 years ago when she was pregnant she was diagnosed with gallstones. After she had her baby she was seen in the ED. However she did not see a surgeon in the office to discuss having her gallbladder removed. She denies any fever. She states since this pain started any food makes the pain worse. Patient's noted to have a bag of potato chips in her purse and she last ate them about 3 hours prior to coming to the ED.  PCP none  Past Medical History  Diagnosis Date  . No pertinent past medical history   . Gallstones    Past Surgical History  Procedure Laterality Date  . No past surgeries     No family history on file. History  Substance Use Topics  . Smoking status: Never Smoker   . Smokeless tobacco: Not on file  . Alcohol Use: No   Stay at home mom  OB History    Gravida Para Term Preterm AB TAB SAB Ectopic Multiple Living   2 2 2       2      Review of Systems  All other systems reviewed and are negative.     Allergies  Review of patient's allergies indicates no known allergies.  Home Medications   Prior to Admission medications   Medication Sig Start Date End Date  Taking? Authorizing Provider  prenatal vitamin w/FE, FA (PRENATAL 1 + 1) 27-1 MG TABS Take 1 tablet by mouth daily.      Historical Provider, MD   ED Triage Vitals  Enc Vitals Group     BP 10/05/14 2206 107/51 mmHg     Pulse Rate 10/05/14 2206 63     Resp 10/05/14 2206 18     Temp 10/05/14 2206 98 F (36.7 C)     Temp Source 10/05/14 2206 Oral     SpO2 10/05/14 2206 100 %     Weight --      Height --      Head Cir --      Peak Flow --      Pain Score 10/05/14 2205 6     Pain Loc --      Pain Edu? --      Excl. in GC? --      Vital signs normal   Physical Exam  Constitutional: She is oriented to person, place, and time. She appears well-developed and well-nourished.  Non-toxic appearance. She does not appear ill. No distress.  HENT:  Head: Normocephalic and atraumatic.  Right Ear: External ear normal.  Left Ear: External ear normal.  Nose: Nose normal. No mucosal edema or rhinorrhea.  Mouth/Throat:  Oropharynx is clear and moist and mucous membranes are normal. No dental abscesses or uvula swelling.  Eyes: Conjunctivae and EOM are normal. Pupils are equal, round, and reactive to light.  Neck: Normal range of motion and full passive range of motion without pain. Neck supple.  Cardiovascular: Normal rate, regular rhythm and normal heart sounds.  Exam reveals no gallop and no friction rub.   No murmur heard. Pulmonary/Chest: Effort normal and breath sounds normal. No respiratory distress. She has no wheezes. She has no rhonchi. She has no rales. She exhibits no tenderness and no crepitus.  Abdominal: Soft. Normal appearance and bowel sounds are normal. She exhibits no distension. There is tenderness. There is no rebound and no guarding.    Patient is tender diffusely in her upper abdomen but states the worst pain is in her epigastric area.  Musculoskeletal: Normal range of motion. She exhibits no edema or tenderness.  Moves all extremities well.   Neurological: She is alert and  oriented to person, place, and time. She has normal strength. No cranial nerve deficit.  Skin: Skin is warm, dry and intact. No rash noted. No erythema. No pallor.  Psychiatric: She has a normal mood and affect. Her speech is normal and behavior is normal. Her mood appears not anxious.  Nursing note and vitals reviewed.   ED Course  Procedures (including critical care time)  Medications  sodium chloride 0.9 % bolus 1,000 mL (0 mLs Intravenous Stopped 10/06/14 0407)  fentaNYL (SUBLIMAZE) injection 50 mcg (50 mcg Intravenous Given 10/06/14 0255)  ondansetron (ZOFRAN) injection 4 mg (4 mg Intravenous Given 10/06/14 0255)  famotidine (PEPCID) IVPB 20 mg (0 mg Intravenous Stopped 10/06/14 0250)    I discussed with the patient about being referred to surgery to discuss having her gallbladder removed. Patient was started on IV fluids, IV pain, and nausea medication.  Nurses report patient left AMA after getting her pain medications.   Labs Review Results for orders placed or performed during the hospital encounter of 10/06/14  CBC WITH DIFFERENTIAL  Result Value Ref Range   WBC 9.2 4.0 - 10.5 K/uL   RBC 4.29 3.87 - 5.11 MIL/uL   Hemoglobin 13.7 12.0 - 15.0 g/dL   HCT 16.1 09.6 - 04.5 %   MCV 93.7 78.0 - 100.0 fL   MCH 31.9 26.0 - 34.0 pg   MCHC 34.1 30.0 - 36.0 g/dL   RDW 40.9 81.1 - 91.4 %   Platelets 225 150 - 400 K/uL   Neutrophils Relative % 47 43 - 77 %   Neutro Abs 4.3 1.7 - 7.7 K/uL   Lymphocytes Relative 46 12 - 46 %   Lymphs Abs 4.3 (H) 0.7 - 4.0 K/uL   Monocytes Relative 6 3 - 12 %   Monocytes Absolute 0.5 0.1 - 1.0 K/uL   Eosinophils Relative 1 0 - 5 %   Eosinophils Absolute 0.1 0.0 - 0.7 K/uL   Basophils Relative 0 0 - 1 %   Basophils Absolute 0.0 0.0 - 0.1 K/uL  Basic metabolic panel  Result Value Ref Range   Sodium 139 137 - 147 mEq/L   Potassium 3.7 3.7 - 5.3 mEq/L   Chloride 103 96 - 112 mEq/L   CO2 26 19 - 32 mEq/L   Glucose, Bld 106 (H) 70 - 99 mg/dL   BUN  15 6 - 23 mg/dL   Creatinine, Ser 7.82 0.50 - 1.10 mg/dL   Calcium 9.7 8.4 - 95.6 mg/dL   GFR calc  non Af Amer >90 >90 mL/min   GFR calc Af Amer >90 >90 mL/min   Anion gap 10 5 - 15  Urinalysis, Routine w reflex microscopic  Result Value Ref Range   Color, Urine YELLOW YELLOW   APPearance CLEAR CLEAR   Specific Gravity, Urine >1.030 (H) 1.005 - 1.030   pH 6.0 5.0 - 8.0   Glucose, UA NEGATIVE NEGATIVE mg/dL   Hgb urine dipstick MODERATE (A) NEGATIVE   Bilirubin Urine NEGATIVE NEGATIVE   Ketones, ur TRACE (A) NEGATIVE mg/dL   Protein, ur NEGATIVE NEGATIVE mg/dL   Urobilinogen, UA 0.2 0.0 - 1.0 mg/dL   Nitrite NEGATIVE NEGATIVE   Leukocytes, UA NEGATIVE NEGATIVE  Pregnancy, urine  Result Value Ref Range   Preg Test, Ur NEGATIVE NEGATIVE  Hepatic function panel  Result Value Ref Range   Total Protein 7.8 6.0 - 8.3 g/dL   Albumin 4.0 3.5 - 5.2 g/dL   AST 16 0 - 37 U/L   ALT 11 0 - 35 U/L   Alkaline Phosphatase 72 39 - 117 U/L   Total Bilirubin 0.2 (L) 0.3 - 1.2 mg/dL   Bilirubin, Direct <1.6<0.2 0.0 - 0.3 mg/dL   Indirect Bilirubin NOT CALCULATED 0.3 - 0.9 mg/dL  Lipase, blood  Result Value Ref Range   Lipase 21 11 - 59 U/L  Urine microscopic-add on  Result Value Ref Range   Squamous Epithelial / LPF MANY (A) RARE   WBC, UA 3-6 <3 WBC/hpf   RBC / HPF 3-6 <3 RBC/hpf   Bacteria, UA MANY (A) RARE    Laboratory interpretation all normal except contaminated urinalysis  Imaging Review No results found.     US abdomen 2012 Clinical Data: Elevated LFTs; right upper quadrant abdominal pain, nausea and vomiting.  ABDOMINAL ULTRASOUND COMPLETE  Comparison: None  Findings:  Gallbladder: Multiple mobile stones are seen layering dependently within the gallbladder, with question of mild associated sludge. The largest stone measures 1.0 cm in size. No gallbladder wall thickening or pericholecystic fluid is seen to suggest cholecystitis. No ultrasonographic Murphy's  sign is elicited.  Common Bile Duct: 0.2 cm in diameter; within normal limits in caliber.  Liver: Normal parenchymal echogenicity and echotexture; no focal lesions identified. Limited Doppler evaluation demonstrates normal blood flow within the liver.   IMPRESSION:  1. Cholelithiasis, with mobile stones seen in the gallbladder and question of mild associated sludge. No evidence for cholecystitis. 2. Otherwise unremarkable abdominal ultrasound.  Original Report Authenticated By: Tonia GhentJEFFREY CHANG, M.D.   EKG Interpretation None      MDM   Final diagnoses:  Epigastric pain  Gallstones    Pt left AMA  Devoria AlbeIva Josilyn Shippee, MD, Armando GangFACEP     Ward GivensIva L Jacelynn Hayton, MD 10/06/14 [phone]

## 2014-10-26 ENCOUNTER — Ambulatory Visit (INDEPENDENT_AMBULATORY_CARE_PROVIDER_SITE_OTHER): Payer: Self-pay | Admitting: General Surgery

## 2014-10-26 NOTE — H&P (Signed)
History of Present Illness Axel Filler MD; 10/26/2014 3:49 PM) Patient words: eval GB.  The patient is a 24 year old female who presents with symptomatic choledocholithiasis. Patient is a 24 year old female who is referred by the ER secondary to symptomatically cholelithiasis. Patient was recently seen in the ER secondary to epigastric abdominal pain. Patient underwent ultrasound which revealed 1 Sameer gallstones as well as multiple small gallstones. Patient states that prior to the onset of abdominal pain she ate some tacos with salsa. She states that she previously was told that she had gallstones approximate 5 years ago when she was pregnant. Patient has not had any return of pain since her ER visit December.   Other Problems Deon Pilling, LPN; 01/25/4097 1:19 PM) Kidney Larina Bras  Past Surgical History (Ammie Eversole, LPN; 10/27/7827 5:62 PM) No pertinent past surgical history  Diagnostic Studies History (Ammie Eversole, LPN; 10/26/863 7:84 PM) Colonoscopy 1-5 years ago Mammogram never Pap Smear never  Allergies (Ammie Eversole, LPN; 03/31/6294 2:84 PM) No Known Drug Allergies01/01/2015  Medication History (Ammie Eversole, LPN; 10/25/2438 1:02 PM) Hydrocodone-Acetaminophen (5-325MG  Tablet, Oral) Active. Ondansetron HCl (  Tablet, Oral) Active.  Social History (Ammie Eversole, LPN; 04/23/5365 4:40 PM) Caffeine use Tea. No alcohol use No drug use Tobacco use Never smoker.  Family History (Ammie Eversole, LPN; 12/25/7423 9:56 PM) Diabetes Mellitus Mother.  Pregnancy / Birth History Deon Pilling, LPN; 12/29/7562 3:32 PM) Age at menarche 12 years. Gravida 2 Maternal age 110-20 Para 2 Regular periods  Review of Systems (Ammie Eversole LPN; 06/28/1883 1:66 PM) General Present- Chills. Not Present- Appetite Loss, Fatigue, Fever, Night Sweats, Weight Gain and Weight Loss. Skin Not Present- Change in Wart/Mole, Dryness, Hives, Jaundice, New Lesions, Non-Healing  Wounds, Rash and Ulcer. HEENT Not Present- Earache, Hearing Loss, Hoarseness, Nose Bleed, Oral Ulcers, Ringing in the Ears, Seasonal Allergies, Sinus Pain, Sore Throat, Visual Disturbances, Wears glasses/contact lenses and Yellow Eyes. Respiratory Not Present- Bloody sputum, Chronic Cough, Difficulty Breathing, Snoring and Wheezing. Breast Not Present- Breast Mass, Breast Pain, Nipple Discharge and Skin Changes. Cardiovascular Not Present- Chest Pain, Difficulty Breathing Lying Down, Leg Cramps, Palpitations, Rapid Heart Rate, Shortness of Breath and Swelling of Extremities. Gastrointestinal Present- Nausea and Vomiting. Not Present- Abdominal Pain, Bloating, Bloody Stool, Change in Bowel Habits, Chronic diarrhea, Constipation, Difficulty Swallowing, Excessive gas, Gets full quickly at meals, Hemorrhoids, Indigestion and Rectal Pain. Female Genitourinary Not Present- Frequency, Nocturia, Painful Urination, Pelvic Pain and Urgency. Musculoskeletal Not Present- Back Pain, Joint Pain, Joint Stiffness, Muscle Pain, Muscle Weakness and Swelling of Extremities. Neurological Not Present- Decreased Memory, Fainting, Headaches, Numbness, Seizures, Tingling, Tremor, Trouble walking and Weakness. Psychiatric Not Present- Anxiety, Bipolar, Change in Sleep Pattern, Depression, Fearful and Frequent crying. Endocrine Not Present- Cold Intolerance, Excessive Hunger, Hair Changes, Heat Intolerance, Hot flashes and New Diabetes. Hematology Not Present- Easy Bruising, Excessive bleeding, Gland problems, HIV and Persistent Infections.   Vitals (Ammie Eversole LPN; 0/03/3015 0:10 PM) 10/26/2014 3:28 PM Weight: 145.2 lb Height: 60in Body Surface Area: 1.67 m Body Mass Index: 28.36 kg/m Temp.: 98.60F(Oral)  Pulse: 80 (Regular)  Resp.: 16 (Unlabored)  BP: 92/60 (Sitting, Left Arm, Standard)    Physical Exam Axel Filler MD; 10/26/2014 3:49 PM) General Mental Status-Alert. General  Appearance-Consistent with stated age. Hydration-Well hydrated. Voice-Normal.  Head and Neck Head-normocephalic, atraumatic with no lesions or palpable masses.  Chest and Lung Exam Chest and lung exam reveals -quiet, even and easy respiratory effort with no use of accessory muscles and on auscultation, normal breath sounds, no  adventitious sounds and normal vocal resonance. Inspection Chest Wall - Normal. Back - normal.  Cardiovascular Cardiovascular examination reveals -on palpation PMI is normal in location and amplitude, no palpable S3 or S4. Normal cardiac borders., normal heart sounds, regular rate and rhythm with no murmurs, carotid auscultation reveals no bruits and normal pedal pulses bilaterally.  Abdomen Inspection Inspection of the abdomen reveals - No Hernias. Skin - Scar - no surgical scars. Palpation/Percussion Palpation and Percussion of the abdomen reveal - Soft, Non Tender, No Rebound tenderness, No Rigidity (guarding) and No hepatosplenomegaly. Auscultation Auscultation of the abdomen reveals - Bowel sounds normal.  Neurologic Neurologic evaluation reveals -alert and oriented x 3 with no impairment of recent or remote memory. Mental Status-Normal.  Musculoskeletal Normal Exam - Left-Upper Extremity Strength Normal and Lower Extremity Strength Normal. Normal Exam - Right-Upper Extremity Strength Normal, Lower Extremity Weakness.    Assessment & Plan Axel Filler MD; 10/26/2014 3:51 PM) SYMPTOMATIC CHOLELITHIASIS (574.20  K80.20) Impression: 24 year old female symptomatically cholelithiasis 1. Patient will like to proceed to the operating room for a laparoscopic cholecystectomy. 2. Risks and benefits were discussed with the patient to generally include, but not limited to: infection, bleeding, possible need for post op ERCP, damage to the bile ducts, bile leak, and possible need for further surgery. Alternatives were offered and described.  All questions were answered and the patient voiced understanding of the procedure and wishes to proceed at this point with a laparoscopic cholecystectomy Current Plans

## 2014-11-05 ENCOUNTER — Encounter (HOSPITAL_COMMUNITY): Payer: Self-pay | Admitting: *Deleted

## 2014-11-05 ENCOUNTER — Encounter (HOSPITAL_COMMUNITY)
Admission: RE | Admit: 2014-11-05 | Discharge: 2014-11-05 | Disposition: A | Discharge status: Home / Self Care | Payer: Medicaid Other | Source: Ambulatory Visit | Attending: General Surgery | Admitting: General Surgery

## 2014-11-05 ENCOUNTER — Ambulatory Visit (HOSPITAL_COMMUNITY)
Admission: RE | Admit: 2014-11-05 | Discharge: 2014-11-06 | Disposition: A | Discharge status: Home / Self Care | Payer: Medicaid Other | Source: Ambulatory Visit | Attending: General Surgery | Admitting: General Surgery

## 2014-11-05 DIAGNOSIS — Z01812 Encounter for preprocedural laboratory examination: Secondary | ICD-10-CM | POA: Insufficient documentation

## 2014-11-05 DIAGNOSIS — Z01818 Encounter for other preprocedural examination: Secondary | ICD-10-CM | POA: Insufficient documentation

## 2014-11-05 DIAGNOSIS — K8020 Calculus of gallbladder without cholecystitis without obstruction: Secondary | ICD-10-CM | POA: Diagnosis present

## 2014-11-05 DIAGNOSIS — K8080 Other cholelithiasis without obstruction: Secondary | ICD-10-CM

## 2014-11-05 DIAGNOSIS — K8010 Calculus of gallbladder with chronic cholecystitis without obstruction: Secondary | ICD-10-CM | POA: Diagnosis not present

## 2014-11-05 LAB — CBC
HCT: 38.8 % (ref 36.0–46.0)
Hemoglobin: 13.3 g/dL (ref 12.0–15.0)
MCH: 31.6 pg (ref 26.0–34.0)
MCHC: 34.3 g/dL (ref 30.0–36.0)
MCV: 92.2 fL (ref 78.0–100.0)
Platelets: 242 10*3/uL (ref 150–400)
RBC: 4.21 MIL/uL (ref 3.87–5.11)
RDW: 12.1 % (ref 11.5–15.5)
WBC: 9.2 10*3/uL (ref 4.0–10.5)

## 2014-11-05 LAB — HCG, SERUM, QUALITATIVE: Preg, Serum: NEGATIVE

## 2014-11-05 MED ORDER — CEFAZOLIN SODIUM-DEXTROSE 2-3 GM-% IV SOLR
2.0000 g | INTRAVENOUS | Status: AC
  Administered 2014-11-06: 2 g via INTRAVENOUS
  Filled 2014-11-05: qty 50

## 2014-11-05 NOTE — Pre-Procedure Instructions (Signed)
Diana LionsMaria E Robertson  11/05/2014   Your procedure is scheduled on:  Friday, January 15.  [facility].  Call this number if you have problems the morning of surgery: [phone]   Remember:   Do not eat food or drink liquids after midnight.   Take these medicines the morning of surgery with A SIP OF WATER: :  If needed: HYDROcodone-acetaminophen (NORCO/VICODIN), ondansetron (ZOFRAN).   Do not wear jewelry, make-up or nail polish.  Do not wear lotions, powders, or perfumes.  Do not shave 48 hours prior to surgery.   Do not bring valuables to the hospital.              [facility] not [facility] for any belongings or valuables.               Contacts, dentures or bridgework may not be worn into surgery.  Leave suitcase in the car. After surgery it may be brought to your room.  For patients admitted to the hospital, discharge time is determined by your  treatment team.               Patients discharged the day of surgery will not be allowed to drive home.  Name and phone number of your driver: -   Special Instructions: Review  [facility] - Preparing For Surgery.   Please read over the following fact sheets that you were given: Pain Booklet, Coughing and Deep Breathing and Surgical Site Infection Prevention

## 2014-11-06 ENCOUNTER — Encounter (HOSPITAL_COMMUNITY): Payer: Self-pay | Admitting: *Deleted

## 2014-11-06 ENCOUNTER — Ambulatory Visit (HOSPITAL_COMMUNITY): Payer: Medicaid Other | Admitting: Anesthesiology

## 2014-11-06 ENCOUNTER — Encounter (HOSPITAL_COMMUNITY)
Admission: RE | Disposition: A | Discharge status: Home / Self Care | Payer: Self-pay | Source: Ambulatory Visit | Attending: General Surgery

## 2014-11-06 DIAGNOSIS — K8010 Calculus of gallbladder with chronic cholecystitis without obstruction: Secondary | ICD-10-CM | POA: Diagnosis not present

## 2014-11-06 DIAGNOSIS — Z01812 Encounter for preprocedural laboratory examination: Secondary | ICD-10-CM | POA: Diagnosis not present

## 2014-11-06 DIAGNOSIS — Z01818 Encounter for other preprocedural examination: Secondary | ICD-10-CM | POA: Diagnosis not present

## 2014-11-06 HISTORY — PX: CHOLECYSTECTOMY: SHX55

## 2014-11-06 SURGERY — LAPAROSCOPIC CHOLECYSTECTOMY
Anesthesia: General | Site: Abdomen

## 2014-11-06 MED ORDER — SODIUM CHLORIDE 0.9 % IV SOLN: 250.0000 mL | INTRAVENOUS | Status: DC | PRN

## 2014-11-06 MED ORDER — BUPIVACAINE HCL (PF) 0.25 % IJ SOLN
INTRAMUSCULAR | Status: AC
Start: 2014-11-06 — End: 2014-11-06
  Filled 2014-11-06: qty 30

## 2014-11-06 MED ORDER — DEXAMETHASONE SODIUM PHOSPHATE 4 MG/ML IJ SOLN
INTRAMUSCULAR | Status: AC
  Filled 2014-11-06: qty 2

## 2014-11-06 MED ORDER — HYDROMORPHONE HCL 1 MG/ML IJ SOLN
INTRAMUSCULAR | Status: AC
  Administered 2014-11-06: 0.5 mg via INTRAVENOUS
  Filled 2014-11-06: qty 1

## 2014-11-06 MED ORDER — ACETAMINOPHEN 325 MG PO TABS: 650.0000 mg | ORAL_TABLET | ORAL | Status: DC | PRN

## 2014-11-06 MED ORDER — OXYCODONE HCL 5 MG/5ML PO SOLN: 5.0000 mg | Freq: Once | ORAL | Status: DC | PRN

## 2014-11-06 MED ORDER — HYDROMORPHONE HCL 1 MG/ML IJ SOLN
0.2500 mg | INTRAMUSCULAR | Status: DC | PRN
Start: 2014-11-06 — End: 2014-11-06
  Administered 2014-11-06 (×4): 0.5 mg via INTRAVENOUS

## 2014-11-06 MED ORDER — GLYCOPYRROLATE 0.2 MG/ML IJ SOLN
INTRAMUSCULAR | Status: DC | PRN
  Administered 2014-11-06: 0.6 mg via INTRAVENOUS

## 2014-11-06 MED ORDER — 0.9 % SODIUM CHLORIDE (POUR BTL) OPTIME
TOPICAL | Status: DC | PRN
  Administered 2014-11-06: 1000 mL

## 2014-11-06 MED ORDER — SODIUM CHLORIDE 0.9 % IJ SOLN: 3.0000 mL | Freq: Two times a day (BID) | INTRAMUSCULAR | Status: DC

## 2014-11-06 MED ORDER — LACTATED RINGERS IV SOLN
INTRAVENOUS | Status: DC | PRN
  Administered 2014-11-06 (×2): via INTRAVENOUS

## 2014-11-06 MED ORDER — OXYCODONE HCL 5 MG PO TABS: 5.0000 mg | ORAL_TABLET | ORAL | Status: DC | PRN

## 2014-11-06 MED ORDER — CHLORHEXIDINE GLUCONATE 4 % EX LIQD
1.0000 "application " | Freq: Once | CUTANEOUS | Status: DC
  Filled 2014-11-06: qty 15

## 2014-11-06 MED ORDER — ARTIFICIAL TEARS OP OINT
TOPICAL_OINTMENT | OPHTHALMIC | Status: DC | PRN
  Administered 2014-11-06: 1 via OPHTHALMIC

## 2014-11-06 MED ORDER — SODIUM CHLORIDE 0.9 % IJ SOLN: 3.0000 mL | INTRAMUSCULAR | Status: DC | PRN

## 2014-11-06 MED ORDER — NEOSTIGMINE METHYLSULFATE 10 MG/10ML IV SOLN
INTRAVENOUS | Status: DC | PRN
Start: 2014-11-06 — End: 2014-11-06
  Administered 2014-11-06: 4 mg via INTRAVENOUS

## 2014-11-06 MED ORDER — EPHEDRINE SULFATE 50 MG/ML IJ SOLN
INTRAMUSCULAR | Status: DC | PRN
  Administered 2014-11-06: 10 mg via INTRAVENOUS

## 2014-11-06 MED ORDER — FENTANYL CITRATE 0.05 MG/ML IJ SOLN
INTRAMUSCULAR | Status: DC | PRN
  Administered 2014-11-06 (×3): 50 ug via INTRAVENOUS

## 2014-11-06 MED ORDER — SODIUM CHLORIDE 0.9 % IR SOLN
Status: DC | PRN
  Administered 2014-11-06: 1000 mL

## 2014-11-06 MED ORDER — ONDANSETRON HCL 4 MG/2ML IJ SOLN
INTRAMUSCULAR | Status: AC
  Filled 2014-11-06: qty 2

## 2014-11-06 MED ORDER — DEXAMETHASONE SODIUM PHOSPHATE 4 MG/ML IJ SOLN
INTRAMUSCULAR | Status: DC | PRN
  Administered 2014-11-06: 8 mg via INTRAVENOUS

## 2014-11-06 MED ORDER — OXYCODONE HCL 5 MG PO TABS
ORAL_TABLET | ORAL | Status: DC
Start: 2014-11-06 — End: 2014-11-06
  Filled 2014-11-06: qty 2

## 2014-11-06 MED ORDER — PROPOFOL 10 MG/ML IV BOLUS
INTRAVENOUS | Status: AC
  Filled 2014-11-06: qty 20

## 2014-11-06 MED ORDER — LIDOCAINE HCL (CARDIAC) 20 MG/ML IV SOLN
INTRAVENOUS | Status: DC | PRN
  Administered 2014-11-06: 60 mg via INTRAVENOUS

## 2014-11-06 MED ORDER — HYDROMORPHONE HCL 1 MG/ML IJ SOLN
INTRAMUSCULAR | Status: AC
  Filled 2014-11-06: qty 1

## 2014-11-06 MED ORDER — OXYCODONE HCL 5 MG PO TABS: 5.0000 mg | ORAL_TABLET | Freq: Once | ORAL | Status: DC | PRN

## 2014-11-06 MED ORDER — ACETAMINOPHEN 650 MG RE SUPP: 650.0000 mg | RECTAL | Status: DC | PRN

## 2014-11-06 MED ORDER — MIDAZOLAM HCL 5 MG/5ML IJ SOLN
INTRAMUSCULAR | Status: DC | PRN
  Administered 2014-11-06: 2 mg via INTRAVENOUS

## 2014-11-06 MED ORDER — LIDOCAINE HCL 4 % MT SOLN
OROMUCOSAL | Status: DC | PRN
  Administered 2014-11-06: 3 mL via TOPICAL

## 2014-11-06 MED ORDER — ONDANSETRON HCL 4 MG/2ML IJ SOLN
INTRAMUSCULAR | Status: AC
  Administered 2014-11-06: 4 mg
  Filled 2014-11-06: qty 2

## 2014-11-06 MED ORDER — MIDAZOLAM HCL 2 MG/2ML IJ SOLN
INTRAMUSCULAR | Status: AC
  Filled 2014-11-06: qty 2

## 2014-11-06 MED ORDER — LACTATED RINGERS IV SOLN
INTRAVENOUS | Status: DC
  Administered 2014-11-06: 12:00:00 via INTRAVENOUS

## 2014-11-06 MED ORDER — ONDANSETRON HCL 4 MG/2ML IJ SOLN
4.0000 mg | Freq: Once | INTRAMUSCULAR | Status: AC | PRN
  Administered 2014-11-06: 4 mg via INTRAVENOUS

## 2014-11-06 MED ORDER — ROCURONIUM BROMIDE 100 MG/10ML IV SOLN
INTRAVENOUS | Status: DC | PRN
  Administered 2014-11-06: 50 mg via INTRAVENOUS

## 2014-11-06 MED ORDER — OXYCODONE-ACETAMINOPHEN 5-325 MG PO TABS: 1.0000 | ORAL_TABLET | ORAL | Status: DC | PRN

## 2014-11-06 MED ORDER — PROPOFOL 10 MG/ML IV BOLUS
INTRAVENOUS | Status: DC | PRN
  Administered 2014-11-06: 200 mg via INTRAVENOUS

## 2014-11-06 MED ORDER — BUPIVACAINE HCL 0.25 % IJ SOLN
INTRAMUSCULAR | Status: DC | PRN
  Administered 2014-11-06: 4 mL

## 2014-11-06 MED ORDER — FENTANYL CITRATE 0.05 MG/ML IJ SOLN
INTRAMUSCULAR | Status: AC
  Filled 2014-11-06: qty 5

## 2014-11-06 SURGICAL SUPPLY — 46 items
BENZOIN TINCTURE PRP APPL 2/3 (GAUZE/BANDAGES/DRESSINGS) ×3 IMPLANT
CANISTER SUCTION 2500CC (MISCELLANEOUS) ×3 IMPLANT
CHLORAPREP W/TINT 26ML (MISCELLANEOUS) ×3 IMPLANT
CLIP LIGATING HEMO O LOK GREEN (MISCELLANEOUS) ×6 IMPLANT
CLOSURE WOUND 1/2 X4 (GAUZE/BANDAGES/DRESSINGS) ×1
COVER MAYO STAND STRL (DRAPES) IMPLANT
COVER SURGICAL LIGHT HANDLE (MISCELLANEOUS) ×3 IMPLANT
COVER TRANSDUCER ULTRASND (DRAPES) ×3 IMPLANT
DECANTER SPIKE VIAL GLASS SM (MISCELLANEOUS) ×3 IMPLANT
DEVICE TROCAR PUNCTURE CLOSURE (ENDOMECHANICALS) ×3 IMPLANT
DRAPE C-ARM 42X72 X-RAY (DRAPES) IMPLANT
DRAPE LAPAROSCOPIC ABDOMINAL (DRAPES) ×3 IMPLANT
DRAPE UTILITY XL STRL (DRAPES) IMPLANT
ELECT REM PT RETURN 9FT ADLT (ELECTROSURGICAL) ×3
ELECTRODE REM PT RTRN 9FT ADLT (ELECTROSURGICAL) ×1 IMPLANT
GAUZE SPONGE 2X2 8PLY STRL LF (GAUZE/BANDAGES/DRESSINGS) ×1 IMPLANT
GLOVE BIO SURGEON STRL SZ7.5 (GLOVE) ×3 IMPLANT
GLOVE BIOGEL PI IND STRL 7.0 (GLOVE) ×4 IMPLANT
GLOVE BIOGEL PI INDICATOR 7.0 (GLOVE) ×8
GLOVE SURG SS PI 7.0 STRL IVOR (GLOVE) ×9 IMPLANT
GOWN STRL REUS W/ TWL LRG LVL3 (GOWN DISPOSABLE) ×3 IMPLANT
GOWN STRL REUS W/ TWL XL LVL3 (GOWN DISPOSABLE) ×1 IMPLANT
GOWN STRL REUS W/TWL LRG LVL3 (GOWN DISPOSABLE) ×6
GOWN STRL REUS W/TWL XL LVL3 (GOWN DISPOSABLE) ×2
IV CATH 14GX2 1/4 (CATHETERS) IMPLANT
KIT BASIN OR (CUSTOM PROCEDURE TRAY) ×3 IMPLANT
KIT ROOM TURNOVER OR (KITS) ×3 IMPLANT
NEEDLE INSUFFLATION 14GA 120MM (NEEDLE) ×3 IMPLANT
NS IRRIG 1000ML POUR BTL (IV SOLUTION) ×3 IMPLANT
PAD ARMBOARD 7.5X6 YLW CONV (MISCELLANEOUS) ×6 IMPLANT
POUCH SPECIMEN RETRIEVAL 10MM (ENDOMECHANICALS) IMPLANT
SCISSORS LAP 5X35 DISP (ENDOMECHANICALS) ×3 IMPLANT
SET CHOLANGIOGRAPHY FRANKLIN (SET/KITS/TRAYS/PACK) IMPLANT
SET IRRIG TUBING LAPAROSCOPIC (IRRIGATION / IRRIGATOR) ×3 IMPLANT
SLEEVE ENDOPATH XCEL 5M (ENDOMECHANICALS) ×3 IMPLANT
SPECIMEN JAR SMALL (MISCELLANEOUS) ×3 IMPLANT
SPONGE GAUZE 2X2 STER 10/PKG (GAUZE/BANDAGES/DRESSINGS) ×2
STRIP CLOSURE SKIN 1/2X4 (GAUZE/BANDAGES/DRESSINGS) ×2 IMPLANT
SUT MNCRL AB 3-0 PS2 18 (SUTURE) ×3 IMPLANT
TAPE CLOTH SURG 4X10 WHT LF (GAUZE/BANDAGES/DRESSINGS) ×3 IMPLANT
TOWEL OR 17X24 6PK STRL BLUE (TOWEL DISPOSABLE) IMPLANT
TOWEL OR 17X26 10 PK STRL BLUE (TOWEL DISPOSABLE) ×3 IMPLANT
TRAY LAPAROSCOPIC (CUSTOM PROCEDURE TRAY) ×3 IMPLANT
TROCAR XCEL NON-BLD 11X100MML (ENDOMECHANICALS) ×3 IMPLANT
TROCAR XCEL NON-BLD 5MMX100MML (ENDOMECHANICALS) ×3 IMPLANT
TUBING INSUFFLATION (TUBING) ×3 IMPLANT

## 2014-11-06 NOTE — Anesthesia Postprocedure Evaluation (Deleted)
  Anesthesia Post-op Note  Patient: Diana Robertson  Procedure(s) Performed: Procedure(s): LAPAROSCOPIC CHOLECYSTECTOMY (N/A)  Patient Location: PACU  Anesthesia Type:General  Level of Consciousness: awake  Airway and Oxygen Therapy: Patient Spontanous Breathing  Post-op Pain: mild  Post-op Assessment: Post-op Vital signs reviewed, Patient's Cardiovascular Status Stable, Respiratory Function Stable, Patent Airway, No signs of Nausea or vomiting and Pain level controlled  Post-op Vital Signs: Reviewed and stable  Last Vitals:  Filed Vitals:   11/06/14 1830  BP: 95/61  Pulse: 63  Temp:   Resp:     Complications: No apparent anesthesia complications

## 2014-11-06 NOTE — Anesthesia Procedure Notes (Signed)
Procedure Name: Intubation Date/Time: 11/06/2014 1:52 PM Performed by: Suzy Bouchard Pre-anesthesia Checklist: Patient identified, Timeout performed, Emergency Drugs available, Suction available and Patient being monitored Patient Re-evaluated:Patient Re-evaluated prior to inductionOxygen Delivery Method: Circle system utilized Preoxygenation: Pre-oxygenation with 100% oxygen Intubation Type: IV induction Ventilation: Mask ventilation without difficulty Laryngoscope Size: Miller and 2 Grade View: Grade I Tube type: Oral Tube size: 7.0 mm Number of attempts: 1 Airway Equipment and Method: Stylet and LTA kit utilized Placement Confirmation: ETT inserted through vocal cords under direct vision,  breath sounds checked- equal and bilateral and positive ETCO2 Secured at: 22 cm Tube secured with: Tape Dental Injury: Teeth and Oropharynx as per pre-operative assessment

## 2014-11-06 NOTE — Anesthesia Preprocedure Evaluation (Addendum)
Anesthesia Evaluation  Patient identified by MRN, date of birth, ID band Patient awake    Reviewed: Allergy & Precautions, NPO status , Patient's Chart, lab work & pertinent test results  Airway Mallampati: II  TM Distance: >3 FB Neck ROM: Full    Dental  (+) Teeth Intact, Dental Advisory Given   Pulmonary neg pulmonary ROS,  breath sounds clear to auscultation        Cardiovascular negative cardio ROS  Rhythm:Regular Rate:Normal     Neuro/Psych negative neurological ROS  negative psych ROS   GI/Hepatic   Endo/Other  negative endocrine ROS  Renal/GU negative Renal ROS  negative genitourinary   Musculoskeletal negative musculoskeletal ROS (+)   Abdominal   Peds negative pediatric ROS (+)  Hematology   Anesthesia Other Findings   Reproductive/Obstetrics negative OB ROS                            Anesthesia Physical Anesthesia Plan  ASA: II  Anesthesia Plan: General   Post-op Pain Management:    Induction: Intravenous  Airway Management Planned: Oral ETT  Additional Equipment:   Intra-op Plan:   Post-operative Plan: Extubation in OR  Informed Consent: I have reviewed the patients History and Physical, chart, labs and discussed the procedure including the risks, benefits and alternatives for the proposed anesthesia with the patient or authorized representative who has indicated his/her understanding and acceptance.   Dental advisory given  Plan Discussed with: CRNA, Anesthesiologist and Surgeon  Anesthesia Plan Comments:         Anesthesia Quick Evaluation

## 2014-11-06 NOTE — Discharge Instructions (Signed)
[facility] ______[facility] °LAPAROSCOPIC SURGERY: POST OP INSTRUCTIONS °Always review your discharge instruction sheet given to you by the facility where your surgery was performed. °IF YOU HAVE DISABILITY OR FAMILY LEAVE FORMS, YOU MUST BRING THEM TO THE OFFICE FOR PROCESSING.   °DO NOT GIVE THEM TO YOUR DOCTOR. ° °1. A prescription for pain medication may be given to you upon discharge.  Take your pain medication as prescribed, if needed.  If narcotic pain medicine is not needed, then you may take acetaminophen (Tylenol) or ibuprofen (Advil) as needed. °2. Take your usually prescribed medications unless otherwise directed. °3. If you need a refill on your pain medication, please contact your pharmacy.  They will contact our office to request authorization. Prescriptions will not be filled after 5pm or on week-ends. °4. You should follow a light diet the first few days after arrival home, such as soup and crackers, etc.  Be sure to include lots of fluids daily. °5. Most patients will experience some swelling and bruising in the area of the incisions.  Ice packs will help.  Swelling and bruising can take several days to resolve.  °6. It is common to experience some constipation if taking pain medication after surgery.  Increasing fluid intake and taking a stool softener (such as Colace) will usually help or prevent this problem from occurring.  A mild laxative (Milk of Magnesia or Miralax) should be taken according to package instructions if there are no bowel movements after 48 hours. °7. Unless discharge instructions indicate otherwise, you may remove your bandages 24-48 hours after surgery, and you may shower at that time.  You may have steri-strips (small skin tapes) in place directly over the incision.  These strips should be left on the skin for 7-10 days.  If your surgeon used skin glue on the incision, you may shower in 24 hours.  The glue will flake off over the next 2-3 weeks.  Any sutures or  staples will be removed at the office during your follow-up visit. °8. ACTIVITIES:  You may resume regular (light) daily activities beginning the next day--such as daily self-care, walking, climbing stairs--gradually increasing activities as tolerated.  You may have sexual intercourse when it is comfortable.  Refrain from any heavy lifting or straining until approved by your doctor. °a. You may drive when you are no longer taking prescription pain medication, you can comfortably wear a seatbelt, and you can safely maneuver your car and apply brakes. °b. RETURN TO WORK:  __________________________________________________________ °9. You should see your doctor in the office for a follow-up appointment approximately 2-3 weeks after your surgery.  Make sure that you call for this appointment within a day or two after you arrive home to insure a convenient appointment time. °10. OTHER INSTRUCTIONS: __________________________________________________________________________________________________________________________ __________________________________________________________________________________________________________________________ °WHEN TO CALL YOUR DOCTOR: °1. Fever over 101.0 °2. Inability to urinate °3. Continued bleeding from incision. °4. Increased pain, redness, or drainage from the incision. °5. Increasing abdominal pain ° °The clinic staff is available to answer your questions during regular business hours.  Please don’t hesitate to call and ask to speak to one of the nurses for clinical concerns.  If you have a medical emergency, go to the nearest emergency room or call 911.  A surgeon from [facility] is always on call at the hospital. °[address], [address], [place], [place]  [address] ? [address] [address], [place],    [address] °[phone] ? [phone] ? FAX [phone] °Web site:   [email or web address] ° °What to eat: ° °For your first meals, you should eat  lightly; only small meals initially.  If you do not have nausea, you may eat larger meals.  Avoid spicy, greasy and heavy food.   ° °General Anesthesia, Adult, Care After  °Refer to this sheet in the next few weeks. These instructions provide you with information on caring for yourself after your procedure. Your health care provider may also give you more specific instructions. Your treatment has been planned according to current medical practices, but problems sometimes occur. Call your health care provider if you have any problems or questions after your procedure.  °WHAT TO EXPECT AFTER THE PROCEDURE  °After the procedure, it is typical to experience:  °Sleepiness.  °Nausea and vomiting. °HOME CARE INSTRUCTIONS  °For the first 24 hours after general anesthesia:  °Have a responsible person with you.  °Do not drive a car. If you are alone, do not take public transportation.  °Do not drink alcohol.  °Do not take medicine that has not been prescribed by your health care provider.  °Do not sign important papers or make important decisions.  °You may resume a normal diet and activities as directed by your health care provider.  °Change bandages (dressings) as directed.  °If you have questions or problems that seem related to general anesthesia, call the hospital and ask for the anesthetist or anesthesiologist on call. °SEEK MEDICAL CARE IF:  °You have nausea and vomiting that continue the day after anesthesia.  °You develop a rash. °SEEK IMMEDIATE MEDICAL CARE IF:  °You have difficulty breathing.  °You have chest pain.  °You have any allergic problems. °Document Released: 01/15/2001 Document Revised: 06/11/2013 Document Reviewed: 04/24/2013  °Exit[employer]® Patient Information ©2014 [employer].  ° ° °

## 2014-11-06 NOTE — Transfer of Care (Signed)
Immediate Anesthesia Transfer of Care Note  Patient: Diana Robertson  Procedure(s) Performed: Procedure(s): LAPAROSCOPIC CHOLECYSTECTOMY (N/A)  Patient Location: PACU  Anesthesia Type:General  Level of Consciousness: awake, alert  and oriented  Airway & Oxygen Therapy: Patient Spontanous Breathing and Patient connected to nasal cannula oxygen  Post-op Assessment: Report given to PACU RN, Post -op Vital signs reviewed and stable and Patient moving all extremities X 4  Post vital signs: Reviewed and stable  Complications: No apparent anesthesia complications

## 2014-11-06 NOTE — Progress Notes (Signed)
Pt ambulated to bathroom with assistance of nurse and husband, pt tol/well, was able to void without difficulty, husbands says pt reported feeling like she was able to empty completely and she is ready to go home. Pt dressed with assistance of husband and transported to vehicle via wheelchair.

## 2014-11-06 NOTE — Progress Notes (Signed)
Interpreter: Ethlyn DanielsMariel Gallepo

## 2014-11-06 NOTE — H&P (View-Only) (Signed)
History of Present Illness Diana Robertson(Diana Savary MD; 10/26/2014 3:49 PM) Patient words: eval GB.  The patient is a 24 year old female who presents with symptomatic choledocholithiasis. Patient is a 24 year old female who is referred by the ER secondary to symptomatically cholelithiasis. Patient was recently seen in the ER secondary to epigastric abdominal pain. Patient underwent ultrasound which revealed 1 Sameer gallstones as well as multiple small gallstones. Patient states that prior to the onset of abdominal pain she ate some tacos with salsa. She states that she previously was told that she had gallstones approximate 5 years ago when she was pregnant. Patient has not had any return of pain since her ER visit December.   Other Problems Diana Robertson(Diana Eversole, LPN; 1/6/10961/01/2015 0:453:29 PM) Kidney Larina BrasStone  Past Surgical History (Diana Eversole, LPN; 4/0/98111/01/2015 9:143:29 PM) No pertinent past surgical history  Diagnostic Studies History (Diana Eversole, LPN; 7/8/29561/01/2015 2:133:29 PM) Colonoscopy 1-5 years ago Mammogram never Pap Smear never  Allergies (Diana Eversole, LPN; 0/8/65781/01/2015 4:693:28 PM) No Known Drug Allergies01/01/2015  Medication History (Diana Eversole, LPN; 6/2/95281/01/2015 4:133:29 PM) Hydrocodone-Acetaminophen (5-325MG  Tablet, Oral) Active. Ondansetron HCl (4MG  Tablet, Oral) Active.  Social History (Diana Eversole, LPN; 2/4/40101/01/2015 2:723:29 PM) Caffeine use Tea. No alcohol use No drug use Tobacco use Never smoker.  Family History (Diana Eversole, LPN; 5/3/66441/01/2015 0:343:29 PM) Diabetes Mellitus Mother.  Pregnancy / Birth History Diana Robertson(Diana Eversole, LPN; 7/4/25951/01/2015 6:383:29 PM) Age at menarche 12 years. Gravida 2 Maternal age 25-20 Para 2 Regular periods  Review of Systems (Diana Eversole LPN; 7/5/64331/01/2015 2:953:29 PM) General Present- Chills. Not Present- Appetite Loss, Fatigue, Fever, Night Sweats, Weight Gain and Weight Loss. Skin Not Present- Change in Wart/Mole, Dryness, Hives, Jaundice, New Lesions, Non-Healing  Wounds, Rash and Ulcer. HEENT Not Present- Earache, Hearing Loss, Hoarseness, Nose Bleed, Oral Ulcers, Ringing in the Ears, Seasonal Allergies, Sinus Pain, Sore Throat, Visual Disturbances, Wears glasses/contact lenses and Yellow Eyes. Respiratory Not Present- Bloody sputum, Chronic Cough, Difficulty Breathing, Snoring and Wheezing. Breast Not Present- Breast Mass, Breast Pain, Nipple Discharge and Skin Changes. Cardiovascular Not Present- Chest Pain, Difficulty Breathing Lying Down, Leg Cramps, Palpitations, Rapid Heart Rate, Shortness of Breath and Swelling of Extremities. Gastrointestinal Present- Nausea and Vomiting. Not Present- Abdominal Pain, Bloating, Bloody Stool, Change in Bowel Habits, Chronic diarrhea, Constipation, Difficulty Swallowing, Excessive gas, Gets full quickly at meals, Hemorrhoids, Indigestion and Rectal Pain. Female Genitourinary Not Present- Frequency, Nocturia, Painful Urination, Pelvic Pain and Urgency. Musculoskeletal Not Present- Back Pain, Joint Pain, Joint Stiffness, Muscle Pain, Muscle Weakness and Swelling of Extremities. Neurological Not Present- Decreased Memory, Fainting, Headaches, Numbness, Seizures, Tingling, Tremor, Trouble walking and Weakness. Psychiatric Not Present- Anxiety, Bipolar, Change in Sleep Pattern, Depression, Fearful and Frequent crying. Endocrine Not Present- Cold Intolerance, Excessive Hunger, Hair Changes, Heat Intolerance, Hot flashes and New Diabetes. Hematology Not Present- Easy Bruising, Excessive bleeding, Gland problems, HIV and Persistent Infections.   Vitals (Diana Eversole LPN; 1/8/84161/01/2015 6:063:28 PM) 10/26/2014 3:28 PM Weight: 145.2 lb Height: 60in Body Surface Area: 1.67 m Body Mass Index: 28.36 kg/m Temp.: 98.58F(Oral)  Pulse: 80 (Regular)  Resp.: 16 (Unlabored)  BP: 92/60 (Sitting, Left Arm, Standard)    Physical Exam Diana Robertson([place] Antolin MD; 10/26/2014 3:49 PM) General Mental Status-Alert. General  Appearance-Consistent with stated age. Hydration-Well hydrated. Voice-Normal.  Head and Neck Head-normocephalic, atraumatic with no lesions or palpable masses.  Chest and Lung Exam Chest and lung exam reveals -quiet, even and easy respiratory effort with no use of accessory muscles and on auscultation, normal breath sounds, no  adventitious sounds and normal vocal resonance. Inspection Chest Wall - Normal. Back - normal.  Cardiovascular Cardiovascular examination reveals -on palpation PMI is normal in location and amplitude, no palpable S3 or S4. Normal cardiac borders., normal heart sounds, regular rate and rhythm with no murmurs, carotid auscultation reveals no bruits and normal pedal pulses bilaterally.  Abdomen Inspection Inspection of the abdomen reveals - No Hernias. Skin - Scar - no surgical scars. Palpation/Percussion Palpation and Percussion of the abdomen reveal - Soft, Non Tender, No Rebound tenderness, No Rigidity (guarding) and No hepatosplenomegaly. Auscultation Auscultation of the abdomen reveals - Bowel sounds normal.  Neurologic Neurologic evaluation reveals -alert and oriented x 3 with no impairment of recent or remote memory. Mental Status-Normal.  Musculoskeletal Normal Exam - Left-Upper Extremity Strength Normal and Lower Extremity Strength Normal. Normal Exam - Right-Upper Extremity Strength Normal, Lower Extremity Weakness.    Assessment & Plan (Eilene Voigt MD; 10/26/2014 3:51 PM) SYMPTOMATIC CHOLELITHIASIS (574.20  K80.20) Impression: 23-year-old female symptomatically cholelithiasis 1. Patient will like to proceed to the operating room for a laparoscopic cholecystectomy. 2. Risks and benefits were discussed with the patient to generally include, but not limited to: infection, bleeding, possible need for post op ERCP, damage to the bile ducts, bile leak, and possible need for further surgery. Alternatives were offered and described.  All questions were answered and the patient voiced understanding of the procedure and wishes to proceed at this point with a laparoscopic cholecystectomy Current Plans 

## 2014-11-06 NOTE — Anesthesia Postprocedure Evaluation (Signed)
  Anesthesia Post-op Note  Patient: Diana Robertson  Procedure(s) Performed: Procedure(s): LAPAROSCOPIC CHOLECYSTECTOMY (N/A)  Patient Location: PACU  Anesthesia Type:General  Level of Consciousness: awake and alert   Airway and Oxygen Therapy: Patient Spontanous Breathing  Post-op Pain: moderate  Post-op Assessment: Post-op Vital signs reviewed, Patient's Cardiovascular Status Stable and Respiratory Function Stable  Post-op Vital Signs: Reviewed  Filed Vitals:   11/06/14 1659  BP: 114/60  Pulse: 63  Temp:   Resp: 15    Complications: No apparent anesthesia complications

## 2014-11-06 NOTE — Interval H&P Note (Signed)
History and Physical Interval Note:  11/06/2014 1:36 PM  Diana LionsMaria E Villagran-Rubio  has presented today for surgery, with the diagnosis of SYMPTOMATIC GALLSTONES  The various methods of treatment have been discussed with the patient and family. After consideration of risks, benefits and other options for treatment, the patient has consented to  Procedure(s): LAPAROSCOPIC CHOLECYSTECTOMY (N/A) as a surgical intervention .  The patient's history has been reviewed, patient examined, no change in status, stable for surgery.  I have reviewed the patient's chart and labs.  Questions were answered to the patient's satisfaction.     Marigene Ehlersamirez Jr., Jed LimerickArmando

## 2014-11-06 NOTE — Op Note (Signed)
11/06/2014  PATIENT:  Diana Robertson  24 y.o. female  PRE-OPERATIVE DIAGNOSIS:  SYMPTOMATIC GALLSTONES  POST-OPERATIVE DIAGNOSIS:  SYMPTOMATIC GALLSTONES  PROCEDURE:  Procedure(s): LAPAROSCOPIC CHOLECYSTECTOMY (N/A)  SURGEON:  Surgeon(s) and Role:    * Axel FillerArmando Kainoa Swoboda, MD - Primary  ASSISTANTS: none   ANESTHESIA:   local and general  EBL:   <5cc BLOOD ADMINISTERED:none  DRAINS: none   LOCAL MEDICATIONS USED:  BUPIVICAINE   SPECIMEN:  Source of Specimen:  gallbladder  DISPOSITION OF SPECIMEN:  PATHOLOGY  COUNTS:  YES  TOURNIQUET:  * No tourniquets in log *  DICTATION: .Dragon Dictation Pre Operative Diagnosis: Biliary colic   Post Operative Diagnosis: same   Procedure: Lap chole with IOC   Surgeon: Dr. Axel FillerArmando Nataya Bastedo   Assistant: none   Anesthesia: GETA   EBL: <5cc   Complications: none   Counts: reported as correct x 2   Findings:chronic cholecystitis and gallstones    Indications for procedure: Pt is a 24 y/o F with RUQ pain and seen to have gallstones.   Details of the procedure: The patient was taken to the operating and placed in the supine position with bilateral SCDs in place. A time out was called and all facts were verified. A pneumoperitoneum was obtained via A Veress needle technique to a pressure of 14mm of mercury. A 5mm trochar was then placed in the right upper quadrant under visualization, and there were no injuries to any abdominal organs. A 11 mm port was then placed in the umbilical region after infiltrating with local anesthesia under direct visualization. A second epigastric port was placed under direct visualization. The gallbladder was identified and retracted, the peritoneum was then sharply dissected from the gallbladder and this dissection was carried down to Calot's triangle. The cystic duct was identified and stripped away circumferentially and seen going into the gallbladder 360, the critical angle was obtained.  2 clips  were placed proximally one distally and the cystic duct transected. The cystic artery was identified and 2 clips placed proximally and one distally and transected. We then proceeded to remove the gallbladder off the hepatic fossa with Bovie cautery. A retrieval bag was then placed in the abdomen and gallbladder placed in the bag. The hepatic fossa was then reexamined and hemostasis was achieved with Bovie cautery and was excellent at this portion of the case. The subhepatic fossa and perihepatic fossa was then irrigated until the effluent was clear. The 11 mm trocar fascia was reapproximated with the Endo Close #1 Vicryl x2. The pneumoperitoneum was evacuated and all trochars removed under direct visulalization. The skin was then closed with 4-0 Monocryl and the skin dressed with Steri-Strips, gauze, and tape. The patient was awaken from general anesthesia and taken to the recovery room in stable condition.   PLAN OF CARE: Discharge to home after PACU  PATIENT DISPOSITION:  PACU - hemodynamically stable.   Delay start of Pharmacological VTE agent (>24hrs) due to surgical blood loss or risk of bleeding: not applicable

## 2014-11-09 ENCOUNTER — Encounter (HOSPITAL_COMMUNITY): Payer: Self-pay | Admitting: General Surgery

## 2020-01-07 ENCOUNTER — Ambulatory Visit (LOCAL_COMMUNITY_HEALTH_CENTER): Payer: Self-pay | Admitting: Physician Assistant

## 2020-01-07 ENCOUNTER — Other Ambulatory Visit: Payer: Self-pay

## 2020-01-07 ENCOUNTER — Ambulatory Visit: Payer: Self-pay

## 2020-01-07 VITALS — BP 102/67 | Ht 66.0 in | Wt 136.0 lb

## 2020-01-07 DIAGNOSIS — Z3009 Encounter for other general counseling and advice on contraception: Secondary | ICD-10-CM

## 2020-01-07 NOTE — Progress Notes (Signed)
Consulted by RN re:  Patient request for removal/reinsertion today.  Patient was scheduled for removal only.  Counseled RN that she may have a wait due to what may go on with patients scheduled before and after her and if patient is ok with waiting, can do removal/reinsertion today or offer to reschedule to have both done at one appointment with 40 min time slot.

## 2020-01-07 NOTE — Progress Notes (Signed)
In desiring Nexplanon removal & to discuss reinsertion Sharlette Dense, RN Discussed with C. Hampton,PA-offered client waiting today for reinsertion or rescheduling for both procedures.  Decided to reschedule-given appt. For 01/14/20 for removal/reinsertion Sharlette Dense, RN

## 2020-01-14 ENCOUNTER — Other Ambulatory Visit: Payer: Self-pay

## 2020-01-14 ENCOUNTER — Ambulatory Visit (LOCAL_COMMUNITY_HEALTH_CENTER): Payer: Self-pay | Admitting: Physician Assistant

## 2020-01-14 ENCOUNTER — Ambulatory Visit: Payer: Self-pay

## 2020-01-14 VITALS — BP 106/78 | Ht 66.0 in | Wt 183.2 lb

## 2020-01-14 DIAGNOSIS — Z3009 Encounter for other general counseling and advice on contraception: Secondary | ICD-10-CM

## 2020-01-14 DIAGNOSIS — Z3046 Encounter for surveillance of implantable subdermal contraceptive: Secondary | ICD-10-CM

## 2020-01-14 MED ORDER — ETONOGESTREL 68 MG ~~LOC~~ IMPL
68.0000 mg | DRUG_IMPLANT | Freq: Once | SUBCUTANEOUS | Status: AC
  Administered 2020-01-14: 68 mg via SUBCUTANEOUS

## 2020-01-14 NOTE — Progress Notes (Signed)
Pt states current nexplanon was placed 10/15/2016; desires old nexplanon to be removed today and new nexplanon placed today. Last sex was 3 days ago with condom; denies sex without condom within the last 7 days. Condoms accepted. Language line used for interpreter services.

## 2020-01-14 NOTE — Progress Notes (Signed)
Pt reviewed and signed ROI for [facility] Dept to release last physical exam and last PAP records to [facility]. Salli Real, interpreter, assisted.

## 2020-01-14 NOTE — Progress Notes (Signed)
Family Planning Visit- Repeat Yearly Visit  Subjective:  Diana Robertson is a 29 y.o. being seen today for a Nexplanon removal/reinsertion.    She is currently using Nexplanon for pregnancy prevention. Patient reports she does not  Want a pregnancy in the next year. Patient  has Gall stones; Gallstones; Supervision of other normal pregnancy; and Spontaneous vaginal delivery on their problem list.  Chief Complaint  Patient presents with  . Contraception    Nexplanon removal & reinsertion    Patient reports that she wants a Nexplanon removal/reinsertion today.  Reports that she had PE and pap at [facility] last year, she take MVI daily and has had cholecystectomy.  Patient denies any chronic conditions, smoking, migraines and clotting disorders.   Does the patient desire a pregnancy in the next year? (OKQ flowsheet)  See flowsheet for other program required questions.   Body mass index is 29.57 kg/m. - Patient is eligible for diabetes screening based on BMI and age >16?  not applicable XW9U ordered? not applicable  Patient reports 1 of partners in last year. Desires STI screening?  No - .  Does the patient have a current or past history of drug use? No   No components found for: HCV]   Health Maintenance Due  Topic Date Due  . PAP-Cervical Cytology Screening  06/06/2014  . PAP SMEAR-Modifier  06/06/2014    Review of Systems  All other systems reviewed and are negative.   The following portions of the patient's history were reviewed and updated as appropriate: allergies, current medications, past family history, past medical history, past social history, past surgical history and problem list. Problem list updated.  Objective:   Vitals:   01/14/20 0922  BP: 106/78  Weight: 183 lb 3.2 oz (83.1 kg)  Height: 5\' 6"  (1.676 m)    Physical Exam Vitals reviewed.  Constitutional:      General: She is not in acute distress.    Appearance: Normal appearance. She  is obese.  HENT:     Head: Normocephalic and atraumatic.  Pulmonary:     Effort: Pulmonary effort is normal.  Neurological:     Mental Status: She is alert and oriented to person, place, and time.  Psychiatric:        Mood and Affect: Mood normal.        Behavior: Behavior normal.        Thought Content: Thought content normal.        Judgment: Judgment normal.       Assessment and Plan:  MATTILYN CRITES is a 29 y.o. female presenting to the [facility] Department for a family planning visit  Contraception counseling: Reviewed all forms of birth control options in the tiered based approach. available including abstinence; over the counter/barrier methods; hormonal contraceptive medication including pill, patch, ring, injection,contraceptive implant; hormonal and nonhormonal IUDs; permanent sterilization options including vasectomy and the various tubal sterilization modalities. Risks, benefits, and typical effectiveness rates were reviewed.  Questions were answered.  Written information was also given to the patient to review.  Patient desires Nexplanon removal/reinsertion, this was prescribed for patient. She will follow up in  1 yr and prn for surveillance.  She was told to call with any further questions, or with any concerns about this method of contraception.  Emphasized use of condoms 100% of the time for STI prevention.  Patient not eligible for ECP today.  1. Encounter for counseling regarding contraception Counseled patient re:  Above  and when to call clinic for irregular bleeding. Rec condoms with all sex for 10 days after reinsertion today. ROI to [facility] for last PE and pap signed and sent. - etonogestrel (NEXPLANON) implant 68 mg  2. Encounter for removal and reinsertion of Nexplanon Nexplanon Removal and Insertion  Patient identified, informed consent performed, consent signed.   Patient does understand that irregular bleeding is a very common  side effect of this medication. She was advised to have backup contraception for one week after replacement of the implant. Patient deemed to meet WHO criteria for being reasonably certain she is not pregnant.  Appropriate time out taken. Nexplanon site identified. Area prepped in usual sterile fashon. 3 ml of 1% lidocaine with epinephrine was used to anesthetize the area at the distal end of the implant. A small stab incision was made right beside the implant on the distal portion. The Nexplanon rod was grasped manually  and removed without difficulty. There was minimal blood loss. There were no complications.   Confirmed correct location of insertion site. The insertion site was identified 8-10 cm (3-4 inches) from the medial epicondyle of the humerus and 3-5 cm (1.25-2 inches) posterior to (below) the sulcus (groove) between the biceps and triceps muscles of the patient's left arm. New Nexplanon removed from packaging, Device confirmed in needle, then inserted full length of needle and withdrawn per handbook instructions. Nexplanon was able to palpated in the patient's left arm; patient palpated the insert herself.  There was minimal blood loss. Patient insertion site covered with guaze and a pressure bandage to reduce any bruising. The patient tolerated the procedure well and was given post procedure instructions.   - etonogestrel (NEXPLANON) implant 68 mg  Nexplanon:   Counseled patient to take OTC analgesic starting as soon as lidocaine starts to wear off and take regularly for at least 48 hr to decrease discomfort.  Specifically to take with food or milk to decrease stomach upset and for IB 600 mg (3 tablets) every 6 hrs; IB 800 mg (4 tablets) every 8 hrs; or Aleve 2 tablets every 12 hrs.  Salli Real present as interpreter for this visit.   No follow-ups on file.  No future appointments.  Diana Holmes, PA

## 2020-01-16 ENCOUNTER — Encounter: Payer: Self-pay | Admitting: Physician Assistant

## 2020-01-16 NOTE — Progress Notes (Signed)
Received records from [facility].  Per records last RP with pap was 10/18/2018.  PE and pap normal. Will need repeat pap 2022.

## 2022-05-18 ENCOUNTER — Ambulatory Visit: Payer: Self-pay

## 2022-05-18 ENCOUNTER — Encounter: Payer: Self-pay | Admitting: Nurse Practitioner

## 2022-05-18 ENCOUNTER — Ambulatory Visit (LOCAL_COMMUNITY_HEALTH_CENTER): Payer: Self-pay | Admitting: Nurse Practitioner

## 2022-05-18 VITALS — BP 102/69 | Ht 62.0 in | Wt 186.6 lb

## 2022-05-18 DIAGNOSIS — Z113 Encounter for screening for infections with a predominantly sexual mode of transmission: Secondary | ICD-10-CM

## 2022-05-18 DIAGNOSIS — Z01419 Encounter for gynecological examination (general) (routine) without abnormal findings: Secondary | ICD-10-CM

## 2022-05-18 DIAGNOSIS — Z3046 Encounter for surveillance of implantable subdermal contraceptive: Secondary | ICD-10-CM

## 2022-05-18 DIAGNOSIS — Z3009 Encounter for other general counseling and advice on contraception: Secondary | ICD-10-CM

## 2022-05-18 LAB — WET PREP FOR TRICH, YEAST, CLUE
Trichomonas Exam: NEGATIVE
Yeast Exam: NEGATIVE

## 2022-05-18 LAB — HM HIV SCREENING LAB: HM HIV Screening: NEGATIVE

## 2022-05-18 MED ORDER — PRENATAL VITAMIN 27-0.8 MG PO TABS: 1.0000 | ORAL_TABLET | Freq: Every day | ORAL | 0 refills | Status: DC

## 2022-05-18 NOTE — Progress Notes (Signed)
Nexplanon Removal Patient identified, informed consent performed, consent signed.   Appropriate time out taken. Nexplanon site identified.  Area prepped in usual sterile fashon. 3 ml of 1% lidocaine with Epinephrine was used to anesthetize the area at the distal end of the implant and along implant site. A small stab incision was made right beside the implant on the distal portion.  The Nexplanon rod was grasped using left hemostats/manual and removed without difficulty.  There was minimal blood loss. There were no complications.  Steri-strips were applied over the small incision.  A pressure bandage was applied to reduce any bruising.  The patient tolerated the procedure well and was given post procedure instructions.   Glenna Fellows, FNP

## 2022-05-18 NOTE — Progress Notes (Signed)
[facility] DEPARTMENT [facility] [address] Main Number: [phone]    Family Planning Visit- Initial Visit  Subjective:  Diana Robertson is a 31 y.o.  G2P2002   being seen today for an initial annual visit and to discuss reproductive life planning.  The patient is currently using Hormonal Implant for pregnancy prevention. Patient reports   does want a pregnancy in the next year.     report they are looking for a method that provides Other seeking pregnancy   Patient has the following medical conditions has Gall stones; Gallstones; Supervision of other normal pregnancy; and Spontaneous vaginal delivery on their problem list.  Chief Complaint  Patient presents with   Contraception    Remove nexplanon     Patient reports to clinic for a physical, STD screening, and Nexplanon removal     Body mass index is 34.13 kg/m. - Patient is eligible for diabetes screening based on BMI and age >25?  not applicable HA1C ordered? not applicable  Patient reports 1  partner/s in last year. Desires STI screening?  Yes  Has patient been screened once for HCV in the past?  No  No results found for: "HCVAB"  Does the patient have current drug use (including MJ), have a partner with drug use, and/or has been incarcerated since last result? No  If yes-- Screen for HCV through [facility]   Does the patient meet criteria for HBV testing? No  Criteria:  -Household, sexual or needle sharing contact with HBV -History of drug use -HIV positive -Those with known Hep C   Health Maintenance Due  Topic Date Due   COVID-19 Vaccine (1) Never done   Hepatitis C Screening  Never done   HPV VACCINES (2 - 3-dose series) 10/30/2016   PAP SMEAR-Modifier  10/18/2021    Review of Systems  Constitutional:  Negative for chills, fever, malaise/fatigue and weight loss.       Weight gain  HENT:  Negative for congestion, hearing loss and sore throat.    Eyes:  Negative for blurred vision, double vision and photophobia.  Respiratory:  Negative for shortness of breath.   Cardiovascular:  Negative for chest pain.  Gastrointestinal:  Negative for abdominal pain, blood in stool, constipation, diarrhea, heartburn, nausea and vomiting.  Genitourinary:  Negative for dysuria and frequency.  Musculoskeletal:  Negative for back pain, joint pain and neck pain.  Skin:  Negative for itching and rash.  Neurological:  Negative for dizziness, weakness and headaches.  Endo/Heme/Allergies:  Does not bruise/bleed easily.  Psychiatric/Behavioral:  Negative for depression, substance abuse and suicidal ideas.     The following portions of the patient's history were reviewed and updated as appropriate: allergies, current medications, past family history, past medical history, past social history, past surgical history and problem list. Problem list updated.   See flowsheet for other program required questions.  Objective:   Vitals:   05/18/22 1009  BP: 102/69  Weight: 186 lb 9.6 oz (84.6 kg)  Height: 5\' 2"  (1.575 m)    Physical Exam Constitutional:      Appearance: Normal appearance.  HENT:     Head: Normocephalic. No abrasion, masses or laceration. Hair is normal.     Jaw: No tenderness or swelling.     Right Ear: External ear normal.     Left Ear: External ear normal.     Nose: Nose normal.     Mouth/Throat:     Lips: Pink. No  lesions.     Mouth: Mucous membranes are moist. No lacerations or oral lesions.     Dentition: No dental caries.     Tongue: No lesions.     Palate: No mass and lesions.     Pharynx: No pharyngeal swelling, oropharyngeal exudate, posterior oropharyngeal erythema or uvula swelling.     Tonsils: No tonsillar exudate or tonsillar abscesses.  Eyes:     Pupils: Pupils are equal, round, and reactive to light.  Neck:     Thyroid: No thyroid mass, thyromegaly or thyroid tenderness.  Cardiovascular:     Rate and Rhythm:  Normal rate and regular rhythm.  Pulmonary:     Effort: Pulmonary effort is normal.     Breath sounds: Normal breath sounds.  Chest:  Breasts:    Right: Normal. No swelling, mass, nipple discharge, skin change or tenderness.     Left: Normal. No swelling, mass, nipple discharge, skin change or tenderness.  Abdominal:     General: Abdomen is flat. Bowel sounds are normal.     Palpations: Abdomen is soft.     Tenderness: There is no abdominal tenderness. There is no rebound.  Genitourinary:    Pubic Area: No rash or pubic lice.      Labia:        Right: No rash, tenderness or lesion.        Left: No rash, tenderness or lesion.      Vagina: Normal. No vaginal discharge, erythema, tenderness or lesions.     Cervix: No cervical motion tenderness, discharge, lesion or erythema.     Uterus: Normal.      Adnexa:        Right: No tenderness.         Left: No tenderness.       Rectum: Normal.     Comments: Amount Discharge: small  Odor: No pH: less than 4.5 Adheres to vaginal wall: No Color: color of discharge matches the Kiandra Sanguinetti swab  Musculoskeletal:     Cervical back: Full passive range of motion without pain and normal range of motion.  Lymphadenopathy:     Cervical: No cervical adenopathy.     Right cervical: No superficial, deep or posterior cervical adenopathy.    Left cervical: No superficial, deep or posterior cervical adenopathy.     Upper Body:     Right upper body: No supraclavicular, axillary or epitrochlear adenopathy.     Left upper body: No supraclavicular, axillary or epitrochlear adenopathy.     Lower Body: No right inguinal adenopathy. No left inguinal adenopathy.  Skin:    General: Skin is warm and dry.     Findings: No erythema, laceration, lesion or rash.  Neurological:     Mental Status: She is alert and oriented to person, place, and time.  Psychiatric:        Attention and Perception: Attention normal.        Mood and Affect: Mood normal.        Speech:  Speech normal.        Behavior: Behavior normal. Behavior is cooperative.       Assessment and Plan:  Diana Robertson is a 31 y.o. female presenting to the [facility] Department for an initial annual wellness/contraceptive visit  Contraception counseling: Reviewed options based on patient desire and reproductive life plan. Patient is interested in Pregnant/Seeking Pregnancy.   Risks, benefits, and typical effectiveness rates were reviewed.  Questions were answered.  Written information was also given  to the patient to review.    The patient will follow up in  1 years for surveillance.  The patient was told to call with any further questions, or with any concerns about this method of contraception.  Emphasized use of condoms 100% of the time for STI prevention.  Need for ECP was assessed. ECP not offered due to continuous use of a birth control method.    1. Family planning counseling -31 year old female in clinic today for a physical, STD screening, and Nexplanon removal. -ROS reviewed, patient reports some weight gain.  Advised to exercise at least 3 times a week, drink at least 6-8 glasses of water, and limit foods high in fat and carbs.  -Patient desires to become pregnant within the next year. Patient given PNV today.    - Prenatal Vit-Fe Fumarate-FA (PRENATAL VITAMIN) 27-0.8 MG TABS; Take 1 tablet by mouth daily.  Dispense: 250 tablet; Refill: 0  2. Screening for venereal disease -STD screening -Patient accepted all screenings including vaginal CT/GC, rectal GC, wet prep, and bloodwork for HIV/RPR.  Patient meets criteria for HepB screening? No. Ordered? No - low risk Patient meets criteria for HepC screening? No. Ordered? No - low risk   Treat wet prep per standing order Discussed time line for State Lab results and that patient will be called with positive results and encouraged patient to call if she had not heard in 2 weeks.  Counseled to return or seek  care for continued or worsening symptoms Recommended condom use with all sex  Patient is currently using *Nexplanon to prevent pregnancy.    3. Well woman exam with routine gynecological exam -Normal well woman exam. -Next CBE due 04/2025 -PAP performed today.  - IGP, Aptima HPV  4. Nexplanon removal -See nexplanon removal note.   Total time spent: 40 minutes   Return in about 1 year (around 05/19/2023) for Annual well-woman exam.    Glenna Fellows, FNP

## 2022-05-18 NOTE — Progress Notes (Signed)
Pt here for PE, Pap smear, STD screening and Nexplanon removal.  Wet mount results reviewed, no treatment required per SO.  FP packet given to pt.  Berdie Ogren, RN

## 2022-05-22 LAB — GONOCOCCUS CULTURE

## 2022-05-23 LAB — IGP, APTIMA HPV
HPV Aptima: NEGATIVE
PAP Smear Comment: 0

## 2022-10-23 NOTE — L&D Delivery Note (Signed)
Delivery Note The pateint was checked and found to be completely dilated at approximately 2045. AROM was performed at 2050. and the patient immediately began pushing spontaneously. At  2055, a viable female was delivered vaginally, presenting OA with restitution to ROT. The anterior shoulder delivered spontaneously, followed rapidly by the posterior shoulder. The patient delivered over a superficial laceration that did not require any repair.APGAR:8 ,9 ; weight 6lbs 7 oz .   Placenta status: delivered at 2106 with a three vessel cord ,with the following complications: none. Patient was diagnosed with pre eclampsia and induced.  Her blood pressures have not required treatment with medicine. .    Anesthesia: none  Episiotomy:  none Lacerations:  small superficial, no repair needed  Est. Blood Loss (mL):   Mom to postpartum.  Baby to Couplet care / Skin to Skin.  [name] 09/26/2023, 9:10 PM

## 2023-02-06 ENCOUNTER — Ambulatory Visit (LOCAL_COMMUNITY_HEALTH_CENTER): Payer: Self-pay

## 2023-02-06 VITALS — BP 104/80 | Ht 62.0 in | Wt 186.5 lb

## 2023-02-06 DIAGNOSIS — Z3201 Encounter for pregnancy test, result positive: Secondary | ICD-10-CM

## 2023-02-06 LAB — PREGNANCY, URINE: Preg Test, Ur: POSITIVE — AB

## 2023-02-06 MED ORDER — PRENATAL 27-0.8 MG PO TABS: 1.0000 | ORAL_TABLET | Freq: Every day | ORAL | 0 refills | Status: AC

## 2023-02-06 NOTE — Progress Notes (Addendum)
UPT positive. Plans prenatal care at [facility]. Positive preg packet given and reviewed.   The patient was dispensed prenatal vitamins #100 today per SO Dr Ralene Bathe. I provided counseling today regarding the medication. We discussed the medication, the side effects and when to call clinic. Patient given the opportunity to ask questions.Marland Kitchena Questions answered.    Sent to clerk for preadmit. Lang line [phone]. Jerel Shepherd, RN

## 2023-03-26 ENCOUNTER — Other Ambulatory Visit: Payer: Self-pay | Admitting: Advanced Practice Midwife

## 2023-03-26 ENCOUNTER — Encounter: Payer: Self-pay | Admitting: Advanced Practice Midwife

## 2023-03-26 ENCOUNTER — Ambulatory Visit: Payer: Self-pay | Admitting: Advanced Practice Midwife

## 2023-03-26 VITALS — BP 100/70 | HR 67 | Temp 97.4°F | Wt 182.4 lb

## 2023-03-26 DIAGNOSIS — O99210 Obesity complicating pregnancy, unspecified trimester: Secondary | ICD-10-CM | POA: Insufficient documentation

## 2023-03-26 DIAGNOSIS — O99211 Obesity complicating pregnancy, first trimester: Secondary | ICD-10-CM

## 2023-03-26 DIAGNOSIS — Z3481 Encounter for supervision of other normal pregnancy, first trimester: Secondary | ICD-10-CM

## 2023-03-26 HISTORY — DX: Encounter for supervision of other normal pregnancy, first trimester: Z34.81

## 2023-03-26 LAB — WET PREP FOR TRICH, YEAST, CLUE
Trichomonas Exam: NEGATIVE
Yeast Exam: NEGATIVE

## 2023-03-26 LAB — HEMOGLOBIN, FINGERSTICK: Hemoglobin: 12.7 g/dL (ref 11.1–15.9)

## 2023-03-26 NOTE — Progress Notes (Addendum)
Presents for initiation of prenatal care and denies any medical care thus far in pregnancy. Reports exact LMP on 12/26/2022. Desires Materni-T 21 today. Counseled regarding flu vaccine recommendation in pregnancy and declined today. Covid vaccine declined today. Desires flu vaccine in the fall of 2024. Reports born in [place] and arrived to the [place] in 2007. Reports return trips to [place] for a visit x2. Quantiferon TB Gold test today. Jossie Ng, RN Wet prep negative and hgb = 12.7. No interventions required per standing order. OB US appt scheduled for 04/02/23 1015 at [facility]. Counseled to arrive at 1000 with a full bladder. Appt reminder card and [facility] facility address given to client. Jossie Ng, RN

## 2023-03-26 NOTE — Progress Notes (Signed)
[facility] Health Department  Maternal Health Clinic   INITIAL PRENATAL VISIT NOTE  Subjective:  Diana Robertson is a 32 y.o. MHF nonsmoker G3P2002 (13,11) at [redacted]w[redacted]d being seen today to start prenatal care at the [facility] Department. She feels "happy" about planned pregnancy since Nexplanon removed 03/2022. 32 yo employed husband of 14 years feels "happy" about pregnancy and is the father of all of her children. She is not working and living with husband and 2 children. Came to [place] in 2017 from [place].  LMP 12/26/22. Denies u/s or ER use this pregnancy. Last dental exam 11/2021. Denies cigs, vaping, cigars, MJ. Last ETOH 09/2022 (3 beers). Last pap 05/18/22 neg HPV neg.  She is currently monitored for the following issues for this low-risk pregnancy and has Obesity affecting pregnancy BMI=33.6 and Supervision of normal intrauterine pregnancy in multigravida, first trimester on their problem list.  Patient reports no complaints.  Contractions: Not present. Vag. Bleeding: None.  Movement: Absent. Denies leaking of fluid.   Indications for ASA therapy (per uptodate) One of the following: Previous pregnancy with preeclampsia, especially early onset and with an adverse outcome No Multifetal gestation No Chronic hypertension No Type 1 or 2 diabetes mellitus No Chronic kidney disease No Autoimmune disease (antiphospholipid syndrome, systemic lupus erythematosus) No  Two or more of the following: Nulliparity No Obesity (body mass index >30 kg/m2) Yes Family history of preeclampsia in mother or sister No Age ?35 years No Sociodemographic characteristics (African American race, low socioeconomic level) No Personal risk factors (eg, previous pregnancy with low birth weight or small for gestational age infant, previous adverse pregnancy outcome [eg, stillbirth], interval >10 years between pregnancies) Yes   The following portions of the patient's history were reviewed and updated as  appropriate: allergies, current medications, past family history, past medical history, past social history, past surgical history and problem list. Problem list updated.  Objective:   Vitals:   03/26/23 1326  BP: 100/70  Pulse: 67  Temp: (!) 97.4 F (36.3 C)  Weight: 182 lb 6.4 oz (82.7 kg)    Fetal Status: Fetal Heart Rate (bpm): 160 Fundal Height: 12 cm Movement: Absent  Presentation: Undeterminable   Physical Exam Vitals and nursing note reviewed.  Constitutional:      General: She is not in acute distress.    Appearance: Normal appearance. She is well-developed. She is obese.  HENT:     Head: Normocephalic and atraumatic.     Right Ear: External ear normal.     Left Ear: External ear normal.     Nose: Nose normal. No congestion or rhinorrhea.     Mouth/Throat:     Lips: Pink.     Mouth: Mucous membranes are moist.     Dentition: Normal dentition. No dental caries.     Pharynx: Oropharynx is clear. Uvula midline.     Comments: Dentition: fair; last dental exam 11/2021 Eyes:     General: No scleral icterus.    Conjunctiva/sclera: Conjunctivae normal.  Neck:     Thyroid: No thyroid mass, thyromegaly or thyroid tenderness.  Cardiovascular:     Rate and Rhythm: Normal rate.     Pulses: Normal pulses.     Comments: Extremities are warm and well perfused Pulmonary:     Effort: Pulmonary effort is normal.     Breath sounds: Normal breath sounds.  Chest:     Chest wall: No mass.  Breasts:    Tanner Score is 5.  Breasts are symmetrical.     Right: Normal. No mass, nipple discharge or skin change.     Left: Normal. No mass, nipple discharge or skin change.  Abdominal:     Palpations: Abdomen is soft.     Tenderness: There is no abdominal tenderness.     Comments: Gravid, soft without masses or tenderness, poor tone, increased adipose   Genitourinary:    General: Normal vulva.     Exam position: Lithotomy position.     Pubic Area: No rash.      Labia:         Right: No rash.        Left: No rash.      Vagina: Normal. No vaginal discharge.     Cervix: No cervical motion tenderness or friability.     Uterus: Normal. Enlarged (Gravid, enlarged 12 wks size, FHR=160). Not tender.      Rectum: Normal. No external hemorrhoid.  Musculoskeletal:     Right lower leg: No edema.     Left lower leg: No edema.  Lymphadenopathy:     Cervical: No cervical adenopathy.     Upper Body:     Right upper body: No axillary adenopathy.     Left upper body: No axillary adenopathy.  Skin:    General: Skin is warm.     Capillary Refill: Capillary refill takes less than 2 seconds.  Neurological:     Mental Status: She is alert.     Assessment and Plan:  Pregnancy: G3P2002 at [redacted]w[redacted]d  1. Obesity affecting pregnancy, antepartum, unspecified obesity type Counseled on weight gain of 11-20 lbs this pregnancy Pt agrees to buy ASA 81 mg daily and begin taking today  2. Supervision of normal intrauterine pregnancy in multigravida, first trimester 1 hour glucola today NIPS today Dating u/s ordered  - Prenatal Profile I - Glucose, 1 hour - Hgb A1c w/o eAG - Comprehensive metabolic panel - Varicella zoster antibody, IgG - QuantiFERON-TB Gold Plus - Protein / creatinine ratio, urine - Lead, blood (adult age 87 yrs or greater) - MaterniT 21 plus Core, Blood - Hgb Fractionation Cascade - TSH + free T4 - 161096 Drug Screen - WET PREP FOR TRICH, YEAST, CLUE - Hemoglobin, venipuncture - US OB LESS THAN 14 WEEKS WITH OB TRANSVAGINAL; Future    Discussed overview of care and coordination with inpatient delivery practices including [facility],  [facility].   Reviewed Centering pregnancy as standard of care at [facility]   Preterm labor symptoms and general obstetric precautions including but not limited to vaginal bleeding, contractions, leaking of fluid and fetal movement were reviewed in detail with the patient.  Please refer to After  Visit Summary for other counseling recommendations.   Return in about 4 weeks (around 04/23/2023) for routine PNC.  No future appointments.  Alberteen Spindle, CNM

## 2023-03-26 NOTE — Progress Notes (Deleted)
Presents for initiation of prenatal care

## 2023-03-28 DIAGNOSIS — O09899 Supervision of other high risk pregnancies, unspecified trimester: Secondary | ICD-10-CM | POA: Insufficient documentation

## 2023-03-28 DIAGNOSIS — Z2839 Other underimmunization status: Secondary | ICD-10-CM | POA: Insufficient documentation

## 2023-03-28 LAB — PREGNANCY, INITIAL SCREEN
Antibody Screen: NEGATIVE
Basophils Absolute: 0 10*3/uL (ref 0.0–0.2)
Basos: 0 %
Bilirubin, UA: NEGATIVE
Chlamydia trachomatis, NAA: NEGATIVE
EOS (ABSOLUTE): 0 10*3/uL (ref 0.0–0.4)
Eos: 1 %
Glucose, UA: NEGATIVE
HCV Ab: NONREACTIVE
HIV Screen 4th Generation wRfx: NONREACTIVE
Hematocrit: 38.2 % (ref 34.0–46.6)
Hemoglobin: 12.8 g/dL (ref 11.1–15.9)
Hepatitis B Surface Ag: NEGATIVE
Immature Grans (Abs): 0 10*3/uL (ref 0.0–0.1)
Immature Granulocytes: 0 %
Ketones, UA: NEGATIVE
Leukocytes,UA: NEGATIVE
Lymphocytes Absolute: 2.2 10*3/uL (ref 0.7–3.1)
Lymphs: 25 %
MCH: 31.8 pg (ref 26.6–33.0)
MCHC: 33.5 g/dL (ref 31.5–35.7)
MCV: 95 fL (ref 79–97)
Monocytes Absolute: 0.4 10*3/uL (ref 0.1–0.9)
Monocytes: 5 %
Neisseria Gonorrhoeae by PCR: NEGATIVE
Neutrophils Absolute: 6.2 10*3/uL (ref 1.4–7.0)
Neutrophils: 69 %
Nitrite, UA: NEGATIVE
Platelets: 256 10*3/uL (ref 150–450)
Protein,UA: NEGATIVE
RBC, UA: NEGATIVE
RBC: 4.03 x10E6/uL (ref 3.77–5.28)
RDW: 11.9 % (ref 11.7–15.4)
RPR Ser Ql: NONREACTIVE
Rh Factor: POSITIVE
Rubella Antibodies, IGG: 0.95 index — ABNORMAL LOW (ref >0.99)
Specific Gravity, UA: 1.011 (ref 1.005–1.030)
Urobilinogen, Ur: 0.2 mg/dL (ref 0.2–1.0)
WBC: 8.9 10*3/uL (ref 3.4–10.8)
pH, UA: 6 (ref 5.0–7.5)

## 2023-03-28 LAB — TSH+FREE T4
Free T4: 1.12 ng/dL (ref 0.82–1.77)
TSH: 0.89 u[IU]/mL (ref 0.450–4.500)

## 2023-03-28 LAB — COMPREHENSIVE METABOLIC PANEL
ALT: 13 IU/L (ref 0–32)
AST: 15 IU/L (ref 0–40)
Albumin/Globulin Ratio: 1.3 (ref 1.2–2.2)
Albumin: 3.8 g/dL — ABNORMAL LOW (ref 3.9–4.9)
Alkaline Phosphatase: 75 IU/L (ref 44–121)
BUN/Creatinine Ratio: 9 (ref 9–23)
BUN: 5 mg/dL — ABNORMAL LOW (ref 6–20)
Bilirubin Total: <0.2 mg/dL (ref 0.0–1.2)
CO2: 21 mmol/L (ref 20–29)
Calcium: 9.6 mg/dL (ref 8.7–10.2)
Chloride: 102 mmol/L (ref 96–106)
Creatinine, Ser: 0.53 mg/dL — ABNORMAL LOW (ref 0.57–1.00)
Globulin, Total: 2.9 g/dL (ref 1.5–4.5)
Glucose: 124 mg/dL — ABNORMAL HIGH (ref 70–99)
Potassium: 3.7 mmol/L (ref 3.5–5.2)
Sodium: 137 mmol/L (ref 134–144)
Total Protein: 6.7 g/dL (ref 6.0–8.5)
eGFR: 126 mL/min/{1.73_m2} (ref >59)

## 2023-03-28 LAB — MICROSCOPIC EXAMINATION
Bacteria, UA: NONE SEEN
Casts: NONE SEEN /lpf
RBC, Urine: NONE SEEN /hpf (ref 0–2)

## 2023-03-28 LAB — 789231 7+OXYCODONE-BUND
Amphetamines, Urine: NEGATIVE ng/mL
BENZODIAZ UR QL: NEGATIVE ng/mL
Barbiturate screen, urine: NEGATIVE ng/mL
Cannabinoid Quant, Ur: NEGATIVE ng/mL
Cocaine (Metab.): NEGATIVE ng/mL
OPIATE SCREEN URINE: NEGATIVE ng/mL
Oxycodone/Oxymorphone, Urine: NEGATIVE ng/mL
PCP Quant, Ur: NEGATIVE ng/mL

## 2023-03-28 LAB — GLUCOSE, 1 HOUR GESTATIONAL: Gestational Diabetes Screen: 125 mg/dL (ref 70–139)

## 2023-03-28 LAB — VARICELLA ZOSTER ANTIBODY, IGG: Varicella zoster IgG: <135 index — ABNORMAL LOW (ref >165)

## 2023-03-28 LAB — PROTEIN / CREATININE RATIO, URINE
Creatinine, Urine: 46.4 mg/dL
Protein, Ur: 4.2 mg/dL
Protein/Creat Ratio: 91 mg/g creat (ref 0–200)

## 2023-03-28 LAB — HCV INTERPRETATION

## 2023-03-28 LAB — URINE CULTURE, OB REFLEX

## 2023-03-28 LAB — HGB A1C W/O EAG: Hgb A1c MFr Bld: 5.4 % (ref 4.8–5.6)

## 2023-03-29 LAB — LEAD, BLOOD (ADULT >= 16 YRS): Lead-Whole Blood: <1 ug/dL (ref 0.0–3.4)

## 2023-03-31 LAB — MATERNIT 21 PLUS CORE, BLOOD
Fetal Fraction: 11
Result (T21): NEGATIVE
Trisomy 13 (Patau syndrome): NEGATIVE
Trisomy 18 (Edwards syndrome): NEGATIVE
Trisomy 21 (Down syndrome): NEGATIVE

## 2023-03-31 LAB — QUANTIFERON-TB GOLD PLUS
QuantiFERON Mitogen Value: >10 IU/mL
QuantiFERON Nil Value: 0.04 IU/mL
QuantiFERON TB1 Ag Value: 0.05 IU/mL
QuantiFERON TB2 Ag Value: 0.06 IU/mL
QuantiFERON-TB Gold Plus: NEGATIVE

## 2023-03-31 LAB — HGB FRACTIONATION CASCADE
Hgb A2: 2.8 % (ref 1.8–3.2)
Hgb A: 97.2 % (ref 96.4–98.8)
Hgb F: 0 % (ref 0.0–2.0)
Hgb S: 0 %

## 2023-04-02 ENCOUNTER — Ambulatory Visit: Payer: Self-pay

## 2023-04-02 ENCOUNTER — Ambulatory Visit
Admission: RE | Admit: 2023-04-02 | Discharge: 2023-04-02 | Disposition: A | Discharge status: Home / Self Care | Payer: Self-pay | Source: Ambulatory Visit | Attending: Advanced Practice Midwife | Admitting: Advanced Practice Midwife

## 2023-04-02 ENCOUNTER — Other Ambulatory Visit: Payer: Self-pay | Admitting: Advanced Practice Midwife

## 2023-04-02 DIAGNOSIS — Z3481 Encounter for supervision of other normal pregnancy, first trimester: Secondary | ICD-10-CM

## 2023-04-02 DIAGNOSIS — O99210 Obesity complicating pregnancy, unspecified trimester: Secondary | ICD-10-CM

## 2023-04-05 ENCOUNTER — Encounter: Payer: Self-pay | Admitting: Advanced Practice Midwife

## 2023-04-23 ENCOUNTER — Telehealth: Payer: Self-pay | Admitting: Family Medicine

## 2023-04-23 ENCOUNTER — Ambulatory Visit: Payer: Self-pay | Admitting: Advanced Practice Midwife

## 2023-04-23 ENCOUNTER — Telehealth: Payer: Self-pay

## 2023-04-23 VITALS — BP 102/64 | HR 71 | Temp 97.1°F | Wt 182.0 lb

## 2023-04-23 DIAGNOSIS — O99212 Obesity complicating pregnancy, second trimester: Secondary | ICD-10-CM

## 2023-04-23 DIAGNOSIS — O09899 Supervision of other high risk pregnancies, unspecified trimester: Secondary | ICD-10-CM

## 2023-04-23 DIAGNOSIS — Z3481 Encounter for supervision of other normal pregnancy, first trimester: Secondary | ICD-10-CM

## 2023-04-23 DIAGNOSIS — O99210 Obesity complicating pregnancy, unspecified trimester: Secondary | ICD-10-CM

## 2023-04-23 DIAGNOSIS — Z2839 Other underimmunization status: Secondary | ICD-10-CM

## 2023-04-23 DIAGNOSIS — Z3482 Encounter for supervision of other normal pregnancy, second trimester: Secondary | ICD-10-CM

## 2023-04-23 NOTE — Telephone Encounter (Signed)
Call to client with Marlene Yemen and given 05/14/23 1430 (arrive 1400) [place] appt at the [facility]. Counseled to arrive at 1400 with a full bladder and not use the bathroom until after the [place] appt. Facility address provided to client.Jossie Ng, RN

## 2023-04-23 NOTE — Telephone Encounter (Addendum)
Return call to client and counseled left [facility] appt today prior to AFP (genetic screening) blood work being obtained. Requested she return this pm for bloodwork only. Per client, unable to return to [facility] today and requests bloodwork be done at next appt on 05/21/23. As client will be EGA = 20 5/7 on that day, counseled we could draw blood at that appt and pink sticky noted to obtain AFP at that appt. Jossie Ng, RN [employer] ID # [phone] assisted with call. Jossie Ng, RN

## 2023-04-23 NOTE — Progress Notes (Signed)
Client left from [facility] RV appt today without having AFP drawn. Call was made to client to request she return from bloodwork. Per client, unable to return today and desires AFP at next [facility] RV on 05/21/23. Pink sticky noted to obtain at [facility] RV 05/21/23 (EGA will be 20 5/7 on that date). Jossie Ng, RN

## 2023-04-23 NOTE — Telephone Encounter (Signed)
Pt called back about a call made to her husbands phone to get in contact with her in regards to a ultrasound.

## 2023-04-23 NOTE — Progress Notes (Signed)
[facility]  PRENATAL VISIT NOTE  Subjective:  Diana Robertson is a 32 y.o. G3P2002 at [redacted]w[redacted]d being seen today for ongoing prenatal care.  She is currently monitored for the following issues for this low-risk pregnancy and has Obesity affecting pregnancy BMI=33.6; Supervision of normal intrauterine pregnancy in multigravida, first trimester; Susceptible to varicella (non-immune), currently pregnant; and Rubella non-immune status, antepartum on their problem list.  Patient reports no complaints.  Contractions: Not present. Vag. Bleeding: None.  Movement: Absent. Denies leaking of fluid/ROM.   The following portions of the patient's history were reviewed and updated as appropriate: allergies, current medications, past family history, past medical history, past social history, past surgical history and problem list. Problem list updated.  Objective:   Vitals:   04/23/23 0929  BP: 102/64  Pulse: 71  Temp: (!) 97.1 F (36.2 C)  Weight: 182 lb (82.6 kg)    Fetal Status: Fetal Heart Rate (bpm): 132 Fundal Height: 19 cm Movement: Absent     General:  Alert, oriented and cooperative. Patient is in no acute distress.  Skin: Skin is warm and dry. No rash noted.   Cardiovascular: Normal heart rate noted  Respiratory: Normal respiratory effort, no problems with respiration noted  Abdomen: Soft, gravid, appropriate for gestational age.  Pain/Pressure: Absent     Pelvic: Cervical exam deferred        Extremities: Normal range of motion.  Edema: None  Mental Status: Normal mood and affect. Normal behavior. Normal judgment and thought content.   Assessment and Plan:  Pregnancy: G3P2002 at [redacted]w[redacted]d  1. Susceptible to varicella (non-immune), currently pregnant  - AFP, Serum, Open Spina Bifida  2. Rubella non-immune status, antepartum   3. Supervision of normal intrauterine pregnancy in multigravida, first trimester AFP only today NIPS neg  03/26/23 Reviewed 04/02/23 u/s at 13 5/7 Anatomy u/s ordered 1 hour glucola=125 on 03/26/23 Not working Cleaning in her house or walking on porch; encouraged exercise continuously x 20 min 4x/wk - US OB Comp + 14 Wk; Future  4. Obesity affecting pregnancy, antepartum, unspecified obesity type Taking ASA 81 mg daily -2 lb (-0.907 kg)    Preterm labor symptoms and general obstetric precautions including but not limited to vaginal bleeding, contractions, leaking of fluid and fetal movement were reviewed in detail with the patient. Please refer to After Visit Summary for other counseling recommendations.  No follow-ups on file.  No future appointments.  Alberteen Spindle, CNM

## 2023-04-23 NOTE — Addendum Note (Signed)
Addended by: Veatrice Kells on: 04/23/2023 02:30 PM   Modules accepted: Orders

## 2023-04-23 NOTE — Progress Notes (Signed)
Patient here for [facility] RV at 16 5/7. Desires AFP today. Counseled varicella and rubeola non-immune. Literature given and patient counseled she will need vaccination with varivax and MMR post-partum. Burt Knack, RN

## 2023-05-11 NOTE — Addendum Note (Signed)
Addended by: Heywood Bene on: 05/11/2023 01:34 PM   Modules accepted: Orders

## 2023-05-14 ENCOUNTER — Ambulatory Visit
Admission: RE | Admit: 2023-05-14 | Discharge: 2023-05-14 | Disposition: A | Discharge status: Home / Self Care | Payer: Self-pay | Source: Ambulatory Visit | Attending: Advanced Practice Midwife | Admitting: Advanced Practice Midwife

## 2023-05-14 DIAGNOSIS — Z3A19 19 weeks gestation of pregnancy: Secondary | ICD-10-CM | POA: Insufficient documentation

## 2023-05-14 DIAGNOSIS — Z3689 Encounter for other specified antenatal screening: Secondary | ICD-10-CM | POA: Insufficient documentation

## 2023-05-14 DIAGNOSIS — Z3481 Encounter for supervision of other normal pregnancy, first trimester: Secondary | ICD-10-CM

## 2023-05-21 ENCOUNTER — Encounter: Payer: Self-pay | Admitting: Advanced Practice Midwife

## 2023-05-21 ENCOUNTER — Ambulatory Visit: Payer: Self-pay | Admitting: Advanced Practice Midwife

## 2023-05-21 VITALS — BP 99/66 | HR 82 | Temp 97.8°F | Wt 183.4 lb

## 2023-05-21 DIAGNOSIS — Z3481 Encounter for supervision of other normal pregnancy, first trimester: Secondary | ICD-10-CM

## 2023-05-21 DIAGNOSIS — O99210 Obesity complicating pregnancy, unspecified trimester: Secondary | ICD-10-CM

## 2023-05-21 DIAGNOSIS — O99212 Obesity complicating pregnancy, second trimester: Secondary | ICD-10-CM

## 2023-05-21 DIAGNOSIS — O09899 Supervision of other high risk pregnancies, unspecified trimester: Secondary | ICD-10-CM

## 2023-05-21 DIAGNOSIS — Z2839 Other underimmunization status: Secondary | ICD-10-CM

## 2023-05-21 DIAGNOSIS — Z3482 Encounter for supervision of other normal pregnancy, second trimester: Secondary | ICD-10-CM

## 2023-05-21 NOTE — Progress Notes (Signed)
[facility]  PRENATAL VISIT NOTE  Subjective:  Diana Robertson is a 32 y.o. G3P2002 at [redacted]w[redacted]d being seen today for ongoing prenatal care.  She is currently monitored for the following issues for this low-risk pregnancy and has Obesity affecting pregnancy BMI=33.6; Supervision of normal intrauterine pregnancy in multigravida, first trimester; Susceptible to varicella (non-immune), currently pregnant; and Rubella non-immune status, antepartum on their problem list.  Patient reports no complaints.  Contractions: Not present. Vag. Bleeding: None.  Movement: Present. Denies leaking of fluid/ROM.   The following portions of the patient's history were reviewed and updated as appropriate: allergies, current medications, past family history, past medical history, past social history, past surgical history and problem list. Problem list updated.  Objective:   Vitals:   05/21/23 0920  BP: 99/66  Pulse: 82  Temp: 97.8 F (36.6 C)  Weight: 183 lb 6.4 oz (83.2 kg)    Fetal Status: Fetal Heart Rate (bpm): 150 Fundal Height: 24 cm Movement: Present     General:  Alert, oriented and cooperative. Patient is in no acute distress.  Skin: Skin is warm and dry. No rash noted.   Cardiovascular: Normal heart rate noted  Respiratory: Normal respiratory effort, no problems with respiration noted  Abdomen: Soft, gravid, appropriate for gestational age.  Pain/Pressure: Absent     Pelvic: Cervical exam deferred        Extremities: Normal range of motion.  Edema: None  Mental Status: Normal mood and affect. Normal behavior. Normal judgment and thought content.   Assessment and Plan:  Pregnancy: G3P2002 at [redacted]w[redacted]d  1. Susceptible to varicella (non-immune), currently pregnant  - AFP, Serum, Open Spina Bifida  2. Supervision of normal intrauterine pregnancy in multigravida, first trimester Not working Not exercising; encouraged to do so 3-4x/wk x 20  min AFP only today Breakfast today: cake; nutrition counseling done S>D; if continues at next apt then consider u/s Reviewed 05/14/23 u/s at 19 3/7 with no previa, fundal placenta, AFI wnl, normal anatomy, no EFW given Here with 28 yo son Wants Nexplanon pp  3. Obesity affecting pregnancy, antepartum, unspecified obesity type -9.6 oz (-0.272 kg) 1 lb wt gain in last 4 wks Taking ASA 81 mg daily   Preterm labor symptoms and general obstetric precautions including but not limited to vaginal bleeding, contractions, leaking of fluid and fetal movement were reviewed in detail with the patient. Please refer to After Visit Summary for other counseling recommendations.  Return in about 4 weeks (around 06/18/2023) for routine PNC.  Future Appointments  Date Time Provider Department Center  06/18/2023 10:00 AM [facility] PROVIDER [facility] None    Alberteen Spindle, CNM

## 2023-05-21 NOTE — Progress Notes (Signed)
Patient here for MH RV at 20 5/7. Desires AFP today. Burt Knack, RN

## 2023-06-18 ENCOUNTER — Ambulatory Visit: Payer: Self-pay | Admitting: Family Medicine

## 2023-06-18 VITALS — BP 102/66 | HR 82 | Temp 98.2°F | Wt 184.0 lb

## 2023-06-18 DIAGNOSIS — Z3482 Encounter for supervision of other normal pregnancy, second trimester: Secondary | ICD-10-CM

## 2023-06-18 DIAGNOSIS — Z3481 Encounter for supervision of other normal pregnancy, first trimester: Secondary | ICD-10-CM

## 2023-06-18 MED ORDER — ASPIRIN 81 MG PO CHEW
81.0000 mg | CHEWABLE_TABLET | Freq: Every day | ORAL | Status: DC
Start: 2023-06-18 — End: 2023-09-27

## 2023-06-18 MED ORDER — PRENATAL VITAMIN 27-0.8 MG PO TABS
1.0000 | ORAL_TABLET | Freq: Every day | ORAL | Status: DC
Start: 2023-06-18 — End: 2023-09-25

## 2023-06-18 NOTE — Patient Instructions (Addendum)
I have translated the following text using Google translate.  As such, there are many errors.  I apologize for the poor written translation; however, we do not have written  translation services yet. It was wonderful to meet you today. Thank you for allowing me to be a part of your care. Below is a short summary of what we discussed at your visit today: He traducido el siguiente texto con el traductor de Microbiologist. Por lo tanto, hay muchos errores. Pido disculpas por la mala traduccin escrita; sin embargo, an no contamos con servicios de traduccin escrita. Fue maravilloso conocerte hoy. Gracias por permitirme ser parte de tu atencin. A continuacin, se incluye un breve resumen de lo que hablamos en tu visita de hoy:  Pregnancy Today you are 24 weeks and 5 days in pregnancy.  Keep taking your aspirin 81 mg tablets every day until you deliver.  Come back in 2-3 weeks. This will be just after 28 weeks, and we will do many tests then including blood work, one hour sugar test to screen for diabetes of pregnancy, and give the TDAP vaccine to protect baby from pertussis.  [place] tienes [address] y 5 das de [place]. Sigue tomando tus comprimidos de aspirina de 81 mg todos los [employer]. Vuelve en 2 o 3 semanas. Esto ser justo despus de las [address] y haremos [address], incluidos anlisis de [place], [place] prueba de azcar de una hora para detectar la diabetes del Psychiatrist y administraremos la vacuna TDAP para proteger al beb de la tos [place].  Resource regarding exposures that could affect pregnancy: [facility] [employer] is a Dentist with lots of information on the effects of many medications and exposures on your pregnancy. It is free to use, including their web site, phone line, text service, app, or email and live chat.  http://golden-[email or web address]/ Email or live chat: MotherToBaby.org Phone: [phone] Text: [phone] App: search "LactRx"  Recurso  sobre exposiciones que podran afectar el embarazo: Mother To Baby es una organizacin sin fines de lucro con mucha informacin [employer] efectos de muchos medicamentos y exposiciones en tu Psychiatrist. Es de uso gratuito, incluido su sitio web, lnea telefnica, servicio de mensajes de texto, aplicacin o correo electrnico y chat en vivo. CDApps.pl Correo electrnico o chat en vivo: MotherToBaby.org Telfono: [phone] Mensaje de texto: [phone] Aplicacin: busque "LactRx"  Maternal Mental Health If you start to develop the below symptoms of depression, please reach out to [place] for an appointment. There is also a Biomedical scientist Health Hotline at [phone]). This hotline has trained counselors, doulas, and midwifes to real-time support, information, and resources.  Feeling sad or hopeless most of the time Lack of interest in things you used to enjoy Less interest in caring for yourself (dressing, fixing hair) Trouble concentrating Trouble coping with daily tasks Constant worry about your baby Sleeping or eating too much or too little Feeling very anxious or nervous Unexplained irritability or anger Unwanted or scary thoughts Feeling that you are not a good mother Thoughts of hurting yourself or your baby  If you feel you are experiencing a mental health crisis, please reach out to the National Suicide Prevention Hotline at 1-800-273-TALK (815)350-0960). Salud mental materna Si comienza a Environmental education officer los sntomas de depresin que se indican a continuacin, comunquese con nosotros para programar una cita. Tambin hay una lnea directa nacional de salud mental materna al [phone]). Esta lnea directa cuenta con asesores capacitados, doulas y [address] brindan  apoyo, informacin y recursos en tiempo real.  Sentirse triste o desesperanzada la mayor parte del tiempo  Falta de inters en cosas  que sola disfrutar  Menos inters en cuidarse a s misma (vestirse, peinarse)  Dificultad para concentrarse  Dificultad para afrontar las tareas diarias  Preocupacin constante por su beb  Dormir o Microbiologist o muy poco  Sentirse muy ansiosa o nerviosa  Irritabilidad o enojo inexplicables  Pensamientos no deseados o aterradores  Sentirse que no es una buena madre  Pensamientos de Geographical information systems officer dao a s Visual merchandiser o a su beb  Si siente que est atravesando una crisis de salud mental, comunquese con la [employer] de Prevencin del Suicidio al 1-800-273-TALK [phone]).  Fayette Pho, MD

## 2023-06-18 NOTE — Progress Notes (Unsigned)
Phone Spanish interpreter name Cleone Slim ID [phone]  Moving well No contractions Some lower front abdominal cramping when she walks too much Vaginal bleeding? -  Fluids -  CCNC - completed PHQ9 - score of 3  Fundal  24 cm FHR 150

## 2023-06-18 NOTE — Progress Notes (Signed)
Patient here for MH RV at 24 5/7. Needs more PNV today. CMHRP and PHQ9 today. S/S PTL reviewed with patient and literature given.Burt Knack, RN

## 2023-06-20 NOTE — Assessment & Plan Note (Signed)
Stable. Vitals normal. Continue PNV and asa 81 mg.  CCNC - completed PHQ9 - score of 3 F/u 4 weeks

## 2023-07-16 ENCOUNTER — Ambulatory Visit: Payer: Self-pay | Admitting: Advanced Practice Midwife

## 2023-07-16 VITALS — Wt 185.1 lb

## 2023-07-16 DIAGNOSIS — O99210 Obesity complicating pregnancy, unspecified trimester: Secondary | ICD-10-CM

## 2023-07-16 DIAGNOSIS — O99213 Obesity complicating pregnancy, third trimester: Secondary | ICD-10-CM

## 2023-07-16 DIAGNOSIS — Z3481 Encounter for supervision of other normal pregnancy, first trimester: Secondary | ICD-10-CM

## 2023-07-16 DIAGNOSIS — Z3483 Encounter for supervision of other normal pregnancy, third trimester: Secondary | ICD-10-CM

## 2023-07-16 LAB — HEMOGLOBIN, FINGERSTICK: Hemoglobin: 11.9 g/dL (ref 11.1–15.9)

## 2023-07-16 NOTE — Progress Notes (Signed)
In house lab result reviewed during visit. BTHIELE RN

## 2023-07-16 NOTE — Progress Notes (Signed)
[facility]  PRENATAL VISIT NOTE  Subjective:  Diana Robertson is a 32 y.o. G3P2002 at [redacted]w[redacted]d being seen today for ongoing prenatal care.  She is currently monitored for the following issues for this low-risk pregnancy and has Obesity affecting pregnancy BMI=33.6; Supervision of normal intrauterine pregnancy in multigravida, first trimester; Susceptible to varicella (non-immune), currently pregnant; and Rubella non-immune status, antepartum on their problem list.  Patient reports no complaints.  Contractions: Not present. Vag. Bleeding: None.  Movement: Present. Denies leaking of fluid/ROM.   The following portions of the patient's history were reviewed and updated as appropriate: allergies, current medications, past family history, past medical history, past social history, past surgical history and problem list. Problem list updated.  Objective:   Vitals:   07/16/23 1101  Weight: 185 lb 2 oz (84 kg)    Fetal Status: Fetal Heart Rate (bpm): 135 Fundal Height: 29 cm Movement: Present     General:  Alert, oriented and cooperative. Patient is in no acute distress.  Skin: Skin is warm and dry. No rash noted.   Cardiovascular: Normal heart rate noted  Respiratory: Normal respiratory effort, no problems with respiration noted  Abdomen: Soft, gravid, appropriate for gestational age.  Pain/Pressure: Absent     Pelvic: Cervical exam deferred        Extremities: Normal range of motion.  Edema: None  Mental Status: Normal mood and affect. Normal behavior. Normal judgment and thought content.   Assessment and Plan:  Pregnancy: G3P2002 at [redacted]w[redacted]d  1. Supervision of normal intrauterine pregnancy in multigravida, first trimester Not working Not exercising; encouraged to exercise 3-4x/wk x 20 min NIPS neg 03/26/23 AFP only neg 05/21/23 Reviewed 05/14/23 u/s at 19 3/7 with posterior placenta, 3VC, AFI wnl, anatomy wnl 1 hour glucola today   -  HIV-1/HIV-2 Qualitative RNA - RPR - Glucose, 1 hour gestational - Hemoglobin, venipuncture  2. Obesity affecting pregnancy, antepartum, unspecified obesity type Taking ASA 81 mg daily 1 lb 2 oz (0.51 kg) 1 lb wt gain in last 4 wks  - HIV-1/HIV-2 Qualitative RNA - RPR - Glucose, 1 hour gestational - Hemoglobin, venipuncture   Preterm labor symptoms and general obstetric precautions including but not limited to vaginal bleeding, contractions, leaking of fluid and fetal movement were reviewed in detail with the patient. Please refer to After Visit Summary for other counseling recommendations.  Return in about 2 weeks (around 07/30/2023) for routine PNC.  No future appointments.  Alberteen Spindle, CNM

## 2023-07-17 LAB — GLUCOSE, 1 HOUR GESTATIONAL: Gestational Diabetes Screen: 108 mg/dL (ref 70–139)

## 2023-07-17 LAB — RPR: RPR Ser Ql: NONREACTIVE

## 2023-07-18 LAB — HIV-1/HIV-2 QUALITATIVE RNA
HIV-1 RNA, Qualitative: NONREACTIVE
HIV-2 RNA, Qualitative: NONREACTIVE

## 2023-07-30 ENCOUNTER — Ambulatory Visit: Payer: Self-pay | Admitting: Advanced Practice Midwife

## 2023-07-30 VITALS — Wt 183.4 lb

## 2023-07-30 DIAGNOSIS — Z3481 Encounter for supervision of other normal pregnancy, first trimester: Secondary | ICD-10-CM

## 2023-07-30 DIAGNOSIS — U071 COVID-19: Secondary | ICD-10-CM

## 2023-07-30 DIAGNOSIS — O99210 Obesity complicating pregnancy, unspecified trimester: Secondary | ICD-10-CM

## 2023-07-30 DIAGNOSIS — O99213 Obesity complicating pregnancy, third trimester: Secondary | ICD-10-CM

## 2023-07-30 DIAGNOSIS — O98513 Other viral diseases complicating pregnancy, third trimester: Secondary | ICD-10-CM | POA: Insufficient documentation

## 2023-07-30 DIAGNOSIS — Z3483 Encounter for supervision of other normal pregnancy, third trimester: Secondary | ICD-10-CM

## 2023-07-30 NOTE — Progress Notes (Signed)
[facility]  PRENATAL VISIT NOTE  Subjective:  Diana Robertson is a 32 y.o. G3P2002 at [redacted]w[redacted]d being seen today for ongoing prenatal care.  She is currently monitored for the following issues for this low-risk pregnancy and has Obesity affecting pregnancy BMI=33.6; Supervision of normal intrauterine pregnancy in multigravida, first trimester; Susceptible to varicella (non-immune), currently pregnant; Rubella non-immune status, antepartum; and COVID-19 affecting pregnancy in third trimester + 07/30/23 on their problem list.  Patient reports  itchy throat, achy body, cough, fever since yesterday .  Contractions: Not present. Vag. Bleeding: None.  Movement: Present. Denies leaking of fluid/ROM.   The following portions of the patient's history were reviewed and updated as appropriate: allergies, current medications, past family history, past medical history, past social history, past surgical history and problem list. Problem list updated.  Objective:   Vitals:   07/30/23 1504  Weight: 183 lb 6 oz (83.2 kg)    Fetal Status: Fetal Heart Rate (bpm): 143 Fundal Height: 32 cm Movement: Present     General:  Alert, oriented and cooperative. Patient is in no acute distress.  Skin: Skin is warm and dry. No rash noted.   Cardiovascular: Normal heart rate noted  Respiratory: Normal respiratory effort, no problems with respiration noted  Abdomen: Soft, gravid, appropriate for gestational age.  Pain/Pressure: Absent     Pelvic: Cervical exam deferred        Extremities: Normal range of motion.     Mental Status: Normal mood and affect. Normal behavior. Normal judgment and thought content.   Assessment and Plan:  Pregnancy: G3P2002 at [redacted]w[redacted]d  1. Supervision of normal intrauterine pregnancy in multigravida, first trimester Not working Took Tylenol yesterday for fever, achy body, cough, itchy throat; +covid test today in exam room 1 hour glucola=108  on 07/16/23  2. Obesity affecting pregnancy, antepartum, unspecified obesity type -10 oz (-0.283 kg) 2 lb wt loss in last 2 wks Taking ASA 81 mg daily Not exercising; encouraged to do so 3-4x/wk x 20 min  3. COVID-19 affecting pregnancy in third trimester + 07/30/23 C/o itchy throat, cough, achy body, fever since yesterday; whole family was sick and gave it to her Covid test today=+   Preterm labor symptoms and general obstetric precautions including but not limited to vaginal bleeding, contractions, leaking of fluid and fetal movement were reviewed in detail with the patient. Please refer to After Visit Summary for other counseling recommendations.  No follow-ups on file.  No future appointments.  Alberteen Spindle, CNM

## 2023-07-30 NOTE — Progress Notes (Signed)
COVID Rapid test positive in clinic. c/o "fever" yesterday. BTHIELE RN

## 2023-08-13 ENCOUNTER — Ambulatory Visit: Payer: Self-pay | Admitting: Advanced Practice Midwife

## 2023-08-13 VITALS — BP 97/68 | HR 73 | Temp 98.1°F | Wt 186.0 lb

## 2023-08-13 DIAGNOSIS — O99210 Obesity complicating pregnancy, unspecified trimester: Secondary | ICD-10-CM

## 2023-08-13 DIAGNOSIS — Z3483 Encounter for supervision of other normal pregnancy, third trimester: Secondary | ICD-10-CM

## 2023-08-13 DIAGNOSIS — O99213 Obesity complicating pregnancy, third trimester: Secondary | ICD-10-CM

## 2023-08-13 DIAGNOSIS — Z3481 Encounter for supervision of other normal pregnancy, first trimester: Secondary | ICD-10-CM

## 2023-08-13 NOTE — Progress Notes (Signed)
[facility]  PRENATAL VISIT NOTE  Subjective:  Diana Robertson is a 32 y.o. G3P2002 at [redacted]w[redacted]d being seen today for ongoing prenatal care.  She is currently monitored for the following issues for this low-risk pregnancy and has Obesity affecting pregnancy BMI=33.6; Supervision of normal intrauterine pregnancy in multigravida, first trimester; Susceptible to varicella (non-immune), currently pregnant; Rubella non-immune status, antepartum; and COVID-19 affecting pregnancy in third trimester + 07/30/23 on their problem list.  Patient reports no complaints.  Contractions: Not present. Vag. Bleeding: None.  Movement: Present. Denies leaking of fluid/ROM.   The following portions of the patient's history were reviewed and updated as appropriate: allergies, current medications, past family history, past medical history, past social history, past surgical history and problem list. Problem list updated.  Objective:   Vitals:   08/13/23 1029  BP: 97/68  Pulse: 73  Temp: 98.1 F (36.7 C)  Weight: 186 lb (84.4 kg)    Fetal Status: Fetal Heart Rate (bpm): 145 Fundal Height: 33 cm Movement: Present     General:  Alert, oriented and cooperative. Patient is in no acute distress.  Skin: Skin is warm and dry. No rash noted.   Cardiovascular: Normal heart rate noted  Respiratory: Normal respiratory effort, no problems with respiration noted  Abdomen: Soft, gravid, appropriate for gestational age.  Pain/Pressure: Absent     Pelvic: Cervical exam deferred        Extremities: Normal range of motion.  Edema: None  Mental Status: Normal mood and affect. Normal behavior. Normal judgment and thought content.   Assessment and Plan:  Pregnancy: G3P2002 at [redacted]w[redacted]d  1. Supervision of normal intrauterine pregnancy in multigravida, first trimester Not working Walking 3x/wk x 15-20 min 3 lb wt gain in last 2 wks 2 lb (0.907 kg) Recovered from covid dx'd  07/30/23  2. Obesity affecting pregnancy, antepartum, unspecified obesity type 2 lb (0.907 kg) Taking ASA 81 mg daily  Preterm labor symptoms and general obstetric precautions including but not limited to vaginal bleeding, contractions, leaking of fluid and fetal movement were reviewed in detail with the patient. Please refer to After Visit Summary for other counseling recommendations.  Return in about 2 weeks (around 08/27/2023) for routine PNC.  No future appointments.  Alberteen Spindle, CNM

## 2023-08-13 NOTE — Progress Notes (Signed)
Counseled regarding recommendation for RSV vaccine in pregnancy and desires vaccine. VIS given. Natisha Coltrane notified and to come to Room 2 to complete company patient assistance application with client. Client has now decided that she does not want to receive the vaccine.Ms. Romilda Garret notified. Jossie Ng, RN

## 2023-08-27 ENCOUNTER — Ambulatory Visit: Payer: Self-pay | Admitting: Advanced Practice Midwife

## 2023-08-27 VITALS — BP 111/76 | HR 65 | Temp 98.2°F | Wt 187.6 lb

## 2023-08-27 DIAGNOSIS — O99213 Obesity complicating pregnancy, third trimester: Secondary | ICD-10-CM

## 2023-08-27 DIAGNOSIS — O99210 Obesity complicating pregnancy, unspecified trimester: Secondary | ICD-10-CM

## 2023-08-27 DIAGNOSIS — Z3483 Encounter for supervision of other normal pregnancy, third trimester: Secondary | ICD-10-CM

## 2023-08-27 DIAGNOSIS — Z3481 Encounter for supervision of other normal pregnancy, first trimester: Secondary | ICD-10-CM

## 2023-08-27 NOTE — Progress Notes (Signed)
[facility] M[facility] Health Clinic  PRENATAL VISIT NOTE  Subjective:  Diana Robertson is a 32 y.o. G3P2002 at [redacted]w[redacted]d being seen today for ongoing prenatal care.  She is currently monitored for the following issues for this low-risk pregnancy and has Obesity affecting pregnancy BMI=33.6; Supervision of normal intrauterine pregnancy in multigravida, first trimester; Susceptible to varicella (non-immune), currently pregnant; Rubella non-immune status, antepartum; and COVID-19 affecting pregnancy in third trimester + 07/30/23 on their problem list.  Patient reports no complaints.  Contractions: Not present. Vag. Bleeding: None.  Movement: Present. Denies leaking of fluid/ROM.   The following portions of the patient's history were reviewed and updated as appropriate: allergies, current medications, past family history, past medical history, past social history, past surgical history and problem list. Problem list updated.  Objective:   Vitals:   08/27/23 0916  BP: 111/76  Pulse: 65  Temp: 98.2 F (36.8 C)  Weight: 187 lb 9.6 oz (85.1 kg)    Fetal Status: Fetal Heart Rate (bpm): 132 Fundal Height: 35 cm Movement: Present     General:  Alert, oriented and cooperative. Patient is in no acute distress.  Skin: Skin is warm and dry. No rash noted.   Cardiovascular: Normal heart rate noted  Respiratory: Normal respiratory effort, no problems with respiration noted  Abdomen: Soft, gravid, appropriate for gestational age.  Pain/Pressure: Absent     Pelvic: Cervical exam deferred        Extremities: Normal range of motion.  Edema: None  Mental Status: Normal mood and affect. Normal behavior. Normal judgment and thought content.   Assessment and Plan:  Pregnancy: G3P2002 at [redacted]w[redacted]d  1. Supervision of normal intrauterine pregnancy in multigravida, first trimester Not working Walking 3x/wk x 20-25 min Feels well  2. Obesity affecting pregnancy, antepartum,  unspecified obesity type Taking ASA 81 mg daily 3 lb 9.6 oz (1.633 kg) 1 lb wt gain in last 2 wks   Preterm labor symptoms and general obstetric precautions including but not limited to vaginal bleeding, contractions, leaking of fluid and fetal movement were reviewed in detail with the patient. Please refer to After Visit Summary for other counseling recommendations.  Return in about 2 weeks (around 09/10/2023) for routine PNC, 36 wk labs.  No future appointments.  Alberteen Spindle, CNM

## 2023-08-27 NOTE — Progress Notes (Signed)
Per Tish, CHW, patient doesn't qualify for free RSV. Patient counseled to talk with her pediatrician at 1st visit with baby about the RSV vaccine for baby. Patient states understanding.Burt Knack, RN

## 2023-08-27 NOTE — Progress Notes (Signed)
Patient here for MH RV at 34 5/7. Has Kick count cards at home. Would like to apply for RSV.Burt Knack, RN

## 2023-09-10 ENCOUNTER — Ambulatory Visit: Payer: Self-pay | Admitting: Advanced Practice Midwife

## 2023-09-10 VITALS — BP 103/73 | HR 69 | Temp 97.9°F | Wt 193.2 lb

## 2023-09-10 DIAGNOSIS — O99210 Obesity complicating pregnancy, unspecified trimester: Secondary | ICD-10-CM

## 2023-09-10 DIAGNOSIS — Z3483 Encounter for supervision of other normal pregnancy, third trimester: Secondary | ICD-10-CM

## 2023-09-10 DIAGNOSIS — O99213 Obesity complicating pregnancy, third trimester: Secondary | ICD-10-CM

## 2023-09-10 DIAGNOSIS — Z3481 Encounter for supervision of other normal pregnancy, first trimester: Secondary | ICD-10-CM

## 2023-09-10 NOTE — Progress Notes (Signed)
[facility]  PRENATAL VISIT NOTE  Subjective:  Diana Robertson is a 32 y.o. G3P2002 at [redacted]w[redacted]d being seen today for ongoing prenatal care.  She is currently monitored for the following issues for this low-risk pregnancy and has Obesity affecting pregnancy BMI=33.6; Supervision of normal intrauterine pregnancy in multigravida, first trimester; Susceptible to varicella (non-immune), currently pregnant; Rubella non-immune status, antepartum; and COVID-19 affecting pregnancy in third trimester + 07/30/23 on their problem list.  Patient reports no complaints.  Contractions: Not present. Vag. Bleeding: None.  Movement: Present. Denies leaking of fluid/ROM.   The following portions of the patient's history were reviewed and updated as appropriate: allergies, current medications, past family history, past medical history, past social history, past surgical history and problem list. Problem list updated.  Objective:   Vitals:   09/10/23 1102  BP: 103/73  Pulse: 69  Temp: 97.9 F (36.6 C)  Weight: 193 lb 3.2 oz (87.6 kg)    Fetal Status: Fetal Heart Rate (bpm): 137 Fundal Height: 35 cm Movement: Present  Presentation: Vertex  General:  Alert, oriented and cooperative. Patient is in no acute distress.  Skin: Skin is warm and dry. No rash noted.   Cardiovascular: Normal heart rate noted  Respiratory: Normal respiratory effort, no problems with respiration noted  Abdomen: Soft, gravid, appropriate for gestational age.  Pain/Pressure: Absent     Pelvic: Cervical exam deferred        Extremities: Normal range of motion.  Edema: Trace  Mental Status: Normal mood and affect. Normal behavior. Normal judgment and thought content.   Assessment and Plan:  Pregnancy: G3P2002 at [redacted]w[redacted]d  1. Supervision of normal intrauterine pregnancy in multigravida, first trimester Not working GC/Chlamydia/GBS cultures  obtained Knows when to go to L&D Walking 3x/wk  x 20 min C/o increased appetite and craving [employer] cappachuno, potatoes, tortillas 9 lb 3.2 oz (4.173 kg) 6 lb wt gain in last 2 wks Has car seat and crib  - Chlamydia/GC NAA, Confirmation - Culture, beta strep (group b only)  2. Obesity affecting pregnancy, antepartum, unspecified obesity type Taking ASA 81 mg daily 9 lb wt gain in last 2 wks   Preterm labor symptoms and general obstetric precautions including but not limited to vaginal bleeding, contractions, leaking of fluid and fetal movement were reviewed in detail with the patient. Please refer to After Visit Summary for other counseling recommendations.  Return in about 1 week (around 09/17/2023) for routine PNC.  Future Appointments  Date Time Provider Department Center  09/17/2023  9:00 AM [facility] PROVIDER [facility] None    Alberteen Spindle, CNM

## 2023-09-10 NOTE — Progress Notes (Signed)
Declined self-collection of 36 week cultures. 1 week [facility] RV appt scheduled and appt reminder card given. Jossie Ng, RN

## 2023-09-13 DIAGNOSIS — B951 Streptococcus, group B, as the cause of diseases classified elsewhere: Secondary | ICD-10-CM | POA: Insufficient documentation

## 2023-09-13 LAB — CHLAMYDIA/GC NAA, CONFIRMATION
Chlamydia trachomatis, NAA: NEGATIVE
Neisseria gonorrhoeae, NAA: NEGATIVE

## 2023-09-13 LAB — CULTURE, BETA STREP (GROUP B ONLY): Strep Gp B Culture: POSITIVE — AB

## 2023-09-17 ENCOUNTER — Ambulatory Visit: Payer: Self-pay | Admitting: Advanced Practice Midwife

## 2023-09-17 VITALS — BP 112/75 | HR 67 | Temp 98.2°F | Wt 190.2 lb

## 2023-09-17 DIAGNOSIS — U071 COVID-19: Secondary | ICD-10-CM

## 2023-09-17 DIAGNOSIS — O99213 Obesity complicating pregnancy, third trimester: Secondary | ICD-10-CM

## 2023-09-17 DIAGNOSIS — Z3481 Encounter for supervision of other normal pregnancy, first trimester: Secondary | ICD-10-CM

## 2023-09-17 DIAGNOSIS — Z3483 Encounter for supervision of other normal pregnancy, third trimester: Secondary | ICD-10-CM

## 2023-09-17 DIAGNOSIS — O98513 Other viral diseases complicating pregnancy, third trimester: Secondary | ICD-10-CM

## 2023-09-17 DIAGNOSIS — O99210 Obesity complicating pregnancy, unspecified trimester: Secondary | ICD-10-CM

## 2023-09-17 MED ORDER — PRENATAL VITAMIN 27-0.8 MG PO TABS
1.0000 | ORAL_TABLET | Freq: Every day | ORAL | Status: AC
Start: 2023-09-17 — End: ?

## 2023-09-17 NOTE — Progress Notes (Signed)
[facility]  PRENATAL VISIT NOTE  Subjective:  Diana Robertson is a 32 y.o. G3P2002 at [redacted]w[redacted]d being seen today for ongoing prenatal care.  She is currently monitored for the following issues for this low-risk pregnancy and has Obesity affecting pregnancy BMI=33.6; Supervision of normal intrauterine pregnancy in multigravida, first trimester; Susceptible to varicella (non-immune), currently pregnant; Rubella non-immune status, antepartum; COVID-19 affecting pregnancy in third trimester + 07/30/23; and Positive GBS test on their problem list.  Patient reports no complaints.  Contractions: Not present. Vag. Bleeding: None.  Movement: Present. Denies leaking of fluid/ROM.   The following portions of the patient's history were reviewed and updated as appropriate: allergies, current medications, past family history, past medical history, past social history, past surgical history and problem list. Problem list updated.  Objective:   Vitals:   09/17/23 0919  BP: 112/75  Pulse: 67  Temp: 98.2 F (36.8 C)  Weight: 190 lb 3.2 oz (86.3 kg)    Fetal Status: Fetal Heart Rate (bpm): 130 Fundal Height: 36 cm Movement: Present  Presentation: Vertex  General:  Alert, oriented and cooperative. Patient is in no acute distress.  Skin: Skin is warm and dry. No rash noted.   Cardiovascular: Normal heart rate noted  Respiratory: Normal respiratory effort, no problems with respiration noted  Abdomen: Soft, gravid, appropriate for gestational age.  Pain/Pressure: Absent     Pelvic: Cervical exam deferred        Extremities: Normal range of motion.  Edema: Trace  Mental Status: Normal mood and affect. Normal behavior. Normal judgment and thought content.   Assessment and Plan:  Pregnancy: G3P2002 at [redacted]w[redacted]d  1. Supervision of normal intrauterine pregnancy in multigravida, first trimester Not working Walking 4x/wk x 20 min Has car seat and  crib GC/Chlamydia neg 09/10/23 GBS +  - Prenatal Vit-Fe Fumarate-FA (PRENATAL VITAMIN) 27-0.8 MG TABS; Take 1 tablet by mouth daily at 6 (six) AM.  2. Obesity affecting pregnancy, antepartum, unspecified obesity type Taking ASA 81 mg daily 6 lb 3.2 oz (2.812 kg) 3 lb wt loss in last week Has been cutting out cappachinos and tortillas  3. COVID-19 affecting pregnancy in third trimester + 07/30/23    Preterm labor symptoms and general obstetric precautions including but not limited to vaginal bleeding, contractions, leaking of fluid and fetal movement were reviewed in detail with the patient. Please refer to After Visit Summary for other counseling recommendations.  Return in about 1 week (around 09/24/2023) for routine PNC.  Future Appointments  Date Time Provider Department Center  09/26/2023 10:00 AM [facility] PROVIDER [facility] None    Alberteen Spindle, CNM

## 2023-09-25 NOTE — Progress Notes (Unsigned)
[facility] M[facility] Health Clinic  PRENATAL VISIT NOTE  Subjective:  Diana Robertson is a 32 y.o. G3P2002 at [redacted]w[redacted]d being seen today for ongoing prenatal care.  She is currently monitored for the following issues for this low-risk pregnancy and has Obesity affecting pregnancy BMI=33.6; Supervision of normal intrauterine pregnancy in multigravida, first trimester; Susceptible to varicella (non-immune), currently pregnant; Rubella non-immune status, antepartum; COVID-19 affecting pregnancy in third trimester + 07/30/23; and Positive GBS test on their problem list.  Patient reports {sx:14538}.   .  .   . Denies leaking of fluid/ROM.   The following portions of the patient's history were reviewed and updated as appropriate: allergies, current medications, past family history, past medical history, past social history, past surgical history and problem list. Problem list updated.  Objective:  There were no vitals filed for this visit.  Fetal Status:           General:  Alert, oriented and cooperative. Patient is in no acute distress.  Skin: Skin is warm and dry. No rash noted.   Cardiovascular: Normal heart rate noted  Respiratory: Normal respiratory effort, no problems with respiration noted  Abdomen: Soft, gravid, appropriate for gestational age.        Pelvic: {Blank single:19197::"Cervical exam performed","Cervical exam deferred"}        Extremities: Normal range of motion.     Mental Status: Normal mood and affect. Normal behavior. Normal judgment and thought content.   Assessment and Plan:  Pregnancy: G3P2002 at [redacted]w[redacted]d  1. Supervision of normal intrauterine pregnancy in multigravida, first trimester -taking PNV daily  2. Obesity affecting pregnancy, antepartum, unspecified obesity type -taking ASA daily -weight -was walking 4x/week at 20 minutes-   3. [redacted] weeks gestation of pregnancy -reviewed recommendation for IOL at 41-42 weeks- patient  desires.... -reviewed risk/benefit for cervical exam and membrane sweep   {Blank single:19197::"Term","Preterm"} labor symptoms and general obstetric precautions including but not limited to vaginal bleeding, contractions, leaking of fluid and fetal movement were reviewed in detail with the patient. Please refer to After Visit Summary for other counseling recommendations.  No follow-ups on file.  Future Appointments  Date Time Provider Department Center  09/26/2023 10:00 AM [facility] PROVIDER [facility] None   Due to language barrier, interpreter *** was present for this visit.  Lenice Llamas, [place]

## 2023-09-26 ENCOUNTER — Other Ambulatory Visit: Payer: Self-pay

## 2023-09-26 ENCOUNTER — Encounter: Payer: Self-pay | Admitting: Obstetrics and Gynecology

## 2023-09-26 ENCOUNTER — Inpatient Hospital Stay
Admission: EM | Admit: 2023-09-26 | Discharge: 2023-09-27 | DRG: 807 | Disposition: A | Discharge status: Home / Self Care | Payer: Medicaid Other | Source: Ambulatory Visit | Attending: Obstetrics | Admitting: Obstetrics

## 2023-09-26 ENCOUNTER — Encounter: Payer: Self-pay | Admitting: Family Medicine

## 2023-09-26 ENCOUNTER — Ambulatory Visit: Payer: Self-pay | Admitting: Family Medicine

## 2023-09-26 DIAGNOSIS — Z3A39 39 weeks gestation of pregnancy: Secondary | ICD-10-CM

## 2023-09-26 DIAGNOSIS — B951 Streptococcus, group B, as the cause of diseases classified elsewhere: Secondary | ICD-10-CM

## 2023-09-26 DIAGNOSIS — Z23 Encounter for immunization: Secondary | ICD-10-CM | POA: Diagnosis not present

## 2023-09-26 DIAGNOSIS — O1494 Unspecified pre-eclampsia, complicating childbirth: Secondary | ICD-10-CM

## 2023-09-26 DIAGNOSIS — O99824 Streptococcus B carrier state complicating childbirth: Secondary | ICD-10-CM | POA: Diagnosis present

## 2023-09-26 DIAGNOSIS — Z833 Family history of diabetes mellitus: Secondary | ICD-10-CM | POA: Diagnosis not present

## 2023-09-26 DIAGNOSIS — O1490 Unspecified pre-eclampsia, unspecified trimester: Secondary | ICD-10-CM

## 2023-09-26 DIAGNOSIS — O09899 Supervision of other high risk pregnancies, unspecified trimester: Principal | ICD-10-CM

## 2023-09-26 DIAGNOSIS — Z603 Acculturation difficulty: Secondary | ICD-10-CM | POA: Diagnosis present

## 2023-09-26 DIAGNOSIS — O1493 Unspecified pre-eclampsia, third trimester: Secondary | ICD-10-CM

## 2023-09-26 DIAGNOSIS — O163 Unspecified maternal hypertension, third trimester: Secondary | ICD-10-CM | POA: Diagnosis present

## 2023-09-26 DIAGNOSIS — O1404 Mild to moderate pre-eclampsia, complicating childbirth: Secondary | ICD-10-CM | POA: Diagnosis present

## 2023-09-26 DIAGNOSIS — O99210 Obesity complicating pregnancy, unspecified trimester: Secondary | ICD-10-CM

## 2023-09-26 DIAGNOSIS — Z3481 Encounter for supervision of other normal pregnancy, first trimester: Secondary | ICD-10-CM

## 2023-09-26 DIAGNOSIS — O99820 Streptococcus B carrier state complicating pregnancy: Secondary | ICD-10-CM

## 2023-09-26 DIAGNOSIS — O139 Gestational [pregnancy-induced] hypertension without significant proteinuria, unspecified trimester: Secondary | ICD-10-CM | POA: Insufficient documentation

## 2023-09-26 DIAGNOSIS — Z3483 Encounter for supervision of other normal pregnancy, third trimester: Secondary | ICD-10-CM

## 2023-09-26 DIAGNOSIS — O99213 Obesity complicating pregnancy, third trimester: Secondary | ICD-10-CM

## 2023-09-26 DIAGNOSIS — Z2839 Other underimmunization status: Principal | ICD-10-CM

## 2023-09-26 HISTORY — DX: Unspecified pre-eclampsia, unspecified trimester: O14.90

## 2023-09-26 LAB — URINALYSIS
Bilirubin, UA: NEGATIVE
Glucose, UA: NEGATIVE
Nitrite, UA: NEGATIVE
RBC, UA: NEGATIVE
Specific Gravity, UA: >1.03 — ABNORMAL HIGH (ref 1.005–1.030)
Urobilinogen, Ur: 1 mg/dL (ref 0.2–1.0)
pH, UA: 6 (ref 5.0–7.5)

## 2023-09-26 LAB — URINALYSIS, ROUTINE W REFLEX MICROSCOPIC
Bilirubin Urine: NEGATIVE
Glucose, UA: NEGATIVE mg/dL
Hgb urine dipstick: NEGATIVE
Ketones, ur: NEGATIVE mg/dL
Nitrite: NEGATIVE
Protein, ur: NEGATIVE mg/dL
Specific Gravity, Urine: 1.006 (ref 1.005–1.030)
pH: 6 (ref 5.0–8.0)

## 2023-09-26 LAB — ABO/RH: ABO/RH(D): A POS

## 2023-09-26 LAB — TYPE AND SCREEN
ABO/RH(D): A POS
Antibody Screen: NEGATIVE

## 2023-09-26 LAB — PROTEIN / CREATININE RATIO, URINE
Creatinine, Urine: 45 mg/dL
Protein Creatinine Ratio: 0.47 mg/mg{creat} — ABNORMAL HIGH (ref 0.00–0.15)
Total Protein, Urine: 21 mg/dL

## 2023-09-26 LAB — CBC
HCT: 37.5 % (ref 36.0–46.0)
Hemoglobin: 13 g/dL (ref 12.0–15.0)
MCH: 32.6 pg (ref 26.0–34.0)
MCHC: 34.7 g/dL (ref 30.0–36.0)
MCV: 94 fL (ref 80.0–100.0)
Platelets: 154 10*3/uL (ref 150–400)
RBC: 3.99 MIL/uL (ref 3.87–5.11)
RDW: 13.1 % (ref 11.5–15.5)
WBC: 6.7 10*3/uL (ref 4.0–10.5)
nRBC: 0 % (ref 0.0–0.2)

## 2023-09-26 LAB — COMPREHENSIVE METABOLIC PANEL
ALT: 18 U/L (ref 0–44)
AST: 24 U/L (ref 15–41)
Albumin: 2.8 g/dL — ABNORMAL LOW (ref 3.5–5.0)
Alkaline Phosphatase: 148 U/L — ABNORMAL HIGH (ref 38–126)
Anion gap: 8 (ref 5–15)
BUN: 11 mg/dL (ref 6–20)
CO2: 20 mmol/L — ABNORMAL LOW (ref 22–32)
Calcium: 8.8 mg/dL — ABNORMAL LOW (ref 8.9–10.3)
Chloride: 108 mmol/L (ref 98–111)
Creatinine, Ser: 0.63 mg/dL (ref 0.44–1.00)
GFR, Estimated: >60 mL/min (ref >60)
Glucose, Bld: 103 mg/dL — ABNORMAL HIGH (ref 70–99)
Potassium: 3.7 mmol/L (ref 3.5–5.1)
Sodium: 136 mmol/L (ref 135–145)
Total Bilirubin: 0.3 mg/dL (ref <1.2)
Total Protein: 6.1 g/dL — ABNORMAL LOW (ref 6.5–8.1)

## 2023-09-26 MED ORDER — PENICILLIN G POT IN DEXTROSE 60000 UNIT/ML IV SOLN
3.0000 10*6.[IU] | INTRAVENOUS | Status: DC
  Administered 2023-09-26: 3 10*6.[IU] via INTRAVENOUS
  Filled 2023-09-26 (×4): qty 50

## 2023-09-26 MED ORDER — IBUPROFEN 600 MG PO TABS
600.0000 mg | ORAL_TABLET | Freq: Four times a day (QID) | ORAL | Status: DC
  Administered 2023-09-27 (×2): 600 mg via ORAL
  Filled 2023-09-26 (×2): qty 1

## 2023-09-26 MED ORDER — MISOPROSTOL 200 MCG PO TABS
ORAL_TABLET | ORAL | Status: AC
  Filled 2023-09-26: qty 4

## 2023-09-26 MED ORDER — OXYCODONE HCL 5 MG PO TABS
10.0000 mg | ORAL_TABLET | ORAL | Status: DC | PRN
Start: 2023-09-26 — End: 2023-09-28

## 2023-09-26 MED ORDER — AMMONIA AROMATIC IN INHA
RESPIRATORY_TRACT | Status: AC
  Filled 2023-09-26: qty 10

## 2023-09-26 MED ORDER — WITCH HAZEL-GLYCERIN EX PADS: 1.0000 | MEDICATED_PAD | CUTANEOUS | Status: DC | PRN

## 2023-09-26 MED ORDER — DIBUCAINE (PERIANAL) 1 % EX OINT: 1.0000 | TOPICAL_OINTMENT | CUTANEOUS | Status: DC | PRN

## 2023-09-26 MED ORDER — LIDOCAINE HCL (PF) 1 % IJ SOLN: 30.0000 mL | INTRAMUSCULAR | Status: DC | PRN

## 2023-09-26 MED ORDER — COCONUT OIL OIL: 1.0000 | TOPICAL_OIL | Status: DC | PRN

## 2023-09-26 MED ORDER — OXYTOCIN-SODIUM CHLORIDE 30-0.9 UT/500ML-% IV SOLN
1.0000 m[IU]/min | INTRAVENOUS | Status: DC
  Administered 2023-09-26: 6 m[IU]/min via INTRAVENOUS
  Administered 2023-09-26: 2 m[IU]/min via INTRAVENOUS
  Filled 2023-09-26: qty 500

## 2023-09-26 MED ORDER — ACETAMINOPHEN 325 MG PO TABS: 650.0000 mg | ORAL_TABLET | ORAL | Status: DC | PRN

## 2023-09-26 MED ORDER — DIPHENHYDRAMINE HCL 25 MG PO CAPS: 25.0000 mg | ORAL_CAPSULE | Freq: Four times a day (QID) | ORAL | Status: DC | PRN

## 2023-09-26 MED ORDER — DOCUSATE SODIUM 100 MG PO CAPS
100.0000 mg | ORAL_CAPSULE | Freq: Two times a day (BID) | ORAL | Status: DC
  Administered 2023-09-27: 100 mg via ORAL
  Filled 2023-09-26: qty 1

## 2023-09-26 MED ORDER — ZOLPIDEM TARTRATE 5 MG PO TABS: 5.0000 mg | ORAL_TABLET | Freq: Every evening | ORAL | Status: DC | PRN

## 2023-09-26 MED ORDER — OXYCODONE HCL 5 MG PO TABS: 5.0000 mg | ORAL_TABLET | ORAL | Status: DC | PRN

## 2023-09-26 MED ORDER — OXYTOCIN 10 UNIT/ML IJ SOLN
INTRAMUSCULAR | Status: AC
  Filled 2023-09-26: qty 2

## 2023-09-26 MED ORDER — PENICILLIN G POTASSIUM 5000000 UNITS IJ SOLR
5.0000 10*6.[IU] | Freq: Once | INTRAMUSCULAR | Status: AC
  Administered 2023-09-26: 5 10*6.[IU] via INTRAVENOUS
  Filled 2023-09-26: qty 5

## 2023-09-26 MED ORDER — OXYTOCIN BOLUS FROM INFUSION
333.0000 mL | Freq: Once | INTRAVENOUS | Status: AC
  Administered 2023-09-26: 333 mL via INTRAVENOUS

## 2023-09-26 MED ORDER — ONDANSETRON HCL 4 MG PO TABS: 4.0000 mg | ORAL_TABLET | ORAL | Status: DC | PRN

## 2023-09-26 MED ORDER — OXYTOCIN-SODIUM CHLORIDE 30-0.9 UT/500ML-% IV SOLN: 2.5000 [IU]/h | INTRAVENOUS | Status: DC

## 2023-09-26 MED ORDER — OXYCODONE-ACETAMINOPHEN 5-325 MG PO TABS: 1.0000 | ORAL_TABLET | ORAL | Status: DC | PRN

## 2023-09-26 MED ORDER — ONDANSETRON HCL 4 MG/2ML IJ SOLN: 4.0000 mg | INTRAMUSCULAR | Status: DC | PRN

## 2023-09-26 MED ORDER — TERBUTALINE SULFATE 1 MG/ML IJ SOLN: 0.2500 mg | Freq: Once | INTRAMUSCULAR | Status: DC | PRN

## 2023-09-26 MED ORDER — LIDOCAINE HCL (PF) 1 % IJ SOLN
INTRAMUSCULAR | Status: AC
  Filled 2023-09-26: qty 30

## 2023-09-26 MED ORDER — LACTATED RINGERS IV SOLN: INTRAVENOUS | Status: DC

## 2023-09-26 MED ORDER — BENZOCAINE-MENTHOL 20-0.5 % EX AERO: 1.0000 | INHALATION_SPRAY | CUTANEOUS | Status: DC | PRN

## 2023-09-26 MED ORDER — OXYCODONE-ACETAMINOPHEN 5-325 MG PO TABS: 2.0000 | ORAL_TABLET | ORAL | Status: DC | PRN

## 2023-09-26 MED ORDER — PRENATAL MULTIVITAMIN CH
1.0000 | ORAL_TABLET | Freq: Every day | ORAL | Status: DC
  Administered 2023-09-27: 1 via ORAL
  Filled 2023-09-26: qty 1

## 2023-09-26 MED ORDER — SIMETHICONE 80 MG PO CHEW: 80.0000 mg | CHEWABLE_TABLET | ORAL | Status: DC | PRN

## 2023-09-26 MED ORDER — LACTATED RINGERS IV SOLN
500.0000 mL | INTRAVENOUS | Status: DC | PRN
Start: 2023-09-26 — End: 2023-09-26

## 2023-09-26 MED ORDER — FENTANYL CITRATE (PF) 100 MCG/2ML IJ SOLN
50.0000 ug | INTRAMUSCULAR | Status: DC | PRN
  Administered 2023-09-26: 50 ug via INTRAVENOUS
  Filled 2023-09-26: qty 2

## 2023-09-26 MED ORDER — ONDANSETRON HCL 4 MG/2ML IJ SOLN: 4.0000 mg | Freq: Four times a day (QID) | INTRAMUSCULAR | Status: DC | PRN

## 2023-09-26 MED ORDER — SOD CITRATE-CITRIC ACID 500-334 MG/5ML PO SOLN: 30.0000 mL | ORAL | Status: DC | PRN

## 2023-09-26 NOTE — H&P (Signed)
Diana Robertson is a 32 y.o. female presenting for evaluation from the [facility]. She had several elevated blood pressures  at her prenatal visit this morning, and was sent to the hospital for a PIH workup. Her Bps have not been consistently high, but she has had several diastolics in the 90s. She denies any headache or visual changes. She denies any edema, and her baby ahs been moving well today. Her husband is with her. They speak limited English, and a live translator is present for interviews with the patient. Based on labwork she is admitted for induction due to her UPC result of .470 and the new diagnosis of Pre eclampsia. Her cervix is very favorable and we will start with IV pitocin, followed by AROM. She is GBS +, and we will start her PCN upon admission. OB History     Gravida  3   Para  2   Term  2   Preterm  0   AB  0   Living  2      SAB  0   IAB  0   Ectopic  0   Multiple  0   Live Births  2          Past Medical History:  Diagnosis Date   Gallstones    No pertinent past medical history    Overweight    Pre-eclampsia 09/26/2023   Supervision of normal intrauterine pregnancy in multigravida, first trimester 03/26/2023   Past Surgical History:  Procedure Laterality Date   CHOLECYSTECTOMY N/A 11/06/2014   Procedure: LAPAROSCOPIC CHOLECYSTECTOMY;  Surgeon: Axel Filler, MD;  Location: [facility] OR;  Service: General;  Laterality: N/A;   NO PAST SURGERIES     Family History: family history includes Diabetes in her maternal aunt and mother; Healthy in her brother, brother, brother, brother, father, sister, sister, sister, sister, sister, sister, son, and son; Hearing loss in her paternal grandmother; Miscarriages / [place] in her mother. Social History:  reports that she has never smoked. She has been exposed to tobacco smoke. She has never used smokeless tobacco. She reports that she does not currently use alcohol. She reports that she does not use drugs.      Maternal Diabetes: No Genetic Screening: Normal Maternal Ultrasounds/Referrals: Normal Fetal Ultrasounds or other Referrals:  None Maternal Substance Abuse:  No Significant Maternal Medications:  None Significant Maternal Lab Results:  Group B Strep positive Number of Prenatal Visits:greater than 3 verified prenatal visits Maternal Vaccinations:TDap and Flu Other Comments:  None  Review of Systems  Constitutional: Negative.   HENT: Negative.    Eyes: Negative.   Respiratory: Negative.    Cardiovascular: Negative.   Gastrointestinal:        Gravid abdomen  Endocrine: Negative.   Musculoskeletal: Negative.   Allergic/Immunologic: Negative.   Neurological: Negative.   Hematological: Negative.   Psychiatric/Behavioral: Negative.     History Dilation: 3.5 Effacement (%): 60 Station: Ballotable Exam by:: Paula Compton CNM Blood pressure (!) 124/94, pulse 77, temperature 98.2 F (36.8 C), temperature source Oral, resp. rate 18, height 5\' 2"  (1.575 m), weight 86.2 kg, last menstrual period 12/26/2022. Maternal Exam:  Uterine Assessment: Contraction frequency is rare.  Abdomen: Patient reports no abdominal tenderness. Fetal presentation: vertex Introitus: Normal vulva. Normal vagina.  Pelvis: adequate for delivery.   Cervix: Cervix evaluated by digital exam.     Physical Exam Vitals reviewed.  Constitutional:      Appearance: Normal appearance. She is obese.  HENT:  Head: Normocephalic and atraumatic.  Cardiovascular:     Rate and Rhythm: Normal rate and regular rhythm.     Pulses: Normal pulses.     Heart sounds: Normal heart sounds.  Pulmonary:     Effort: Pulmonary effort is normal.     Breath sounds: Normal breath sounds.  Abdominal:     Comments: Gravid abdomen  Genitourinary:    General: Normal vulva.     Rectum: Normal.     Comments: Fundal height is WNL for this gestation. SVE: 3.5-4 cms/60%/ballotable to -3. Located midway.  FHTs 145 baseline, with  moderate variability, accels present and no decels. No contractions noted per toco. Musculoskeletal:        General: Normal range of motion.     Cervical back: Normal range of motion and neck supple.  Skin:    General: Skin is warm and dry.  Neurological:     General: No focal deficit present.     Mental Status: She is alert and oriented to person, place, and time.  Psychiatric:        Mood and Affect: Mood normal.        Behavior: Behavior normal.     Prenatal labs: ABO, Rh: A/Positive/-- (06/03 1551) Antibody: Negative (06/03 1551) Rubella: 0.95 (06/03 1551) RPR: Non Reactive (09/23 1150)  HBsAg: Negative (06/03 1551)  HIV: Non Reactive (06/03 1551)  GBS: Positive/-- (11/18 1227)   Assessment/Plan: 32 year old Gravida 3 Para 2, now [redacted] weeks gestation, being admitted for induction due to diagnosis of preeccclampsia. FHTs are Category 1 Not contracting Prefers IV pain medication, if needed. PCN q 4 hours for her GBS status. EFM Routine admission labs Pitocin . Low dose rate. Will likely perform AROM once she is contracting. Anticipate SVD. Dr. Valentino Saxon notified via text of this admission.  [name], CNM  09/26/2023 3:29 PM     [name] 09/26/2023, 3:11 PM

## 2023-09-26 NOTE — Discharge Summary (Signed)
Postpartum Discharge Summary  Date of Service updated***     Patient Name: Diana Robertson DOB: 07-21-1991 MRN: [id number]  Date of admission: 09/26/2023 Delivery date:09/26/2023 Delivering provider: [name] Date of discharge: 09/26/2023  Admitting diagnosis: Elevated blood pressure affecting pregnancy in third trimester, antepartum [O16.3] Pre-eclampsia in third trimester [O14.93] Intrauterine pregnancy: [redacted]w[redacted]d     Secondary diagnosis:  Principal Problem:   Elevated blood pressure affecting pregnancy in third trimester, antepartum Active Problems:   Pre-eclampsia in third trimester   Postpartum care following vaginal delivery  Additional problems: none    Discharge diagnosis: Term Pregnancy Delivered and Preeclampsia (mild)                                              Post partum procedures:{Postpartum procedures:23558} Augmentation: Pitocin Complications: None  Hospital course: Induction of Labor With Vaginal Delivery   32 y.o. yo G3P2002 at [redacted]w[redacted]d was admitted to the hospital 09/26/2023 for induction of labor.  Indication for induction: Preeclampsia.  Patient had an labor course complicated bynothing Membrane Rupture Time/Date: 8:50 PM,09/26/2023  Delivery Method:Vaginal, Spontaneous Operative Delivery:N/A Episiotomy: None Lacerations:  superficial perineal laceration  Details of delivery can be found in separate delivery note.  Patient had a postpartum course complicated by***. Patient is discharged home 09/26/23.  Newborn Data: Birth date:09/26/2023 Birth time:8:55 PM Gender:Female Living status:  Apgars:8 ,9  Weight:2920 g  Magnesium Sulfate received: No BMZ received: No Rhophylac:N/A MMR:{MMR:30440033}  needs- rubella is equivocal T-DaP:Given prenatally Flu: Yes RSV Vaccine received: No Transfusion:No Immunizations administered: Immunization History  Administered Date(s) Administered   HPV 9-valent 10/02/2016   Hep A / Hep B 10/02/2016    Influenza Split 08/02/2011   Influenza,inj,Quad PF,6+ Mos 07/31/2019   Influenza-Unspecified 07/16/2023   MMR 10/02/2016   Tdap 08/23/2011, 10/02/2016, 07/16/2023   Varicella 10/02/2016    Physical exam  Vitals:   09/26/23 1420 09/26/23 1435 09/26/23 1455 09/26/23 1825  BP: 125/87 (!) 124/94  (!) 144/99  Pulse: 65 77  67  Resp:      Temp:      TempSrc:      Weight:   86.2 kg   Height:   5\' 2"  (1.575 m)    General: {Exam; general:21111117} Lochia: {Desc; appropriate/inappropriate:30686::"appropriate"} Uterine Fundus: {Desc; firm/soft:30687} Incision: {Exam; incision:21111123} DVT Evaluation: {Exam; dvt:2111122} Labs: Lab Results  Component Value Date   WBC 6.7 09/26/2023   HGB 13.0 09/26/2023   HCT 37.5 09/26/2023   MCV 94.0 09/26/2023   PLT 154 09/26/2023      Latest Ref Rng & Units 09/26/2023    1:17 PM  CMP  Glucose 70 - 99 mg/dL 308   BUN 6 - 20 mg/dL 11   Creatinine 6.57 - 1.00 mg/dL 8.46   Sodium 962 - 952 mmol/L 136   Potassium 3.5 - 5.1 mmol/L 3.7   Chloride 98 - 111 mmol/L 108   CO2 22 - 32 mmol/L 20   Calcium 8.9 - 10.3 mg/dL 8.8   Total Protein 6.5 - 8.1 g/dL 6.1   Total Bilirubin <8.4 mg/dL 0.3   Alkaline Phos 38 - 126 U/L 148   AST 15 - 41 U/L 24   ALT 0 - 44 U/L 18    Edinburgh Score:     No data to display  After visit meds:  Allergies as of 09/26/2023   No Known Allergies   Med Rec must be completed prior to using this [facility]***        Discharge home in stable condition Infant Feeding: Breast Infant Disposition:{CHL IP OB HOME WITH ZOXWRU:04540} Discharge instruction: per After Visit Summary and Postpartum booklet. Activity: Advance as tolerated. Pelvic rest for 6 weeks.  Diet: routine diet Anticipated Birth Control: {Birth Control:23956} Postpartum Appointment:1 week for blood pressure check and then again in 6 weeks for a physical and to start birth control Additional Postpartum F/U: BP check 1 week Future  Appointments: Future Appointments  Date Time Provider Department Center  10/04/2023 10:40 AM [facility] PROVIDER [facility] None   Follow up Visit:  Follow-up Information     [facility] DEPT. Schedule an appointment as soon as possible for a visit in 6 week(s).   Why: Please make an appointment for a 6 week post delivery hpysical at the Health Department Contact information: [address] [place] [address] [phone]                    09/26/2023 [name], CNM

## 2023-09-26 NOTE — Progress Notes (Signed)
Provider viewed urine dip results during visit. BTHIELE RN

## 2023-09-26 NOTE — Progress Notes (Signed)
Diana Robertson is a 32 y.o. G3P2002 at [redacted]w[redacted]d by ultrasound admitted for induction of labor due to Pre-eclamptic toxemia of pregnancy.. She continues to deny headache or visual changes.  Subjective: She is feeling her contractions and describes them as mildly painful  Objective: BP (!) 144/99   Pulse 67   Temp 98.2 F (36.8 C) (Oral)   Resp 18   Ht 5\' 2"  (1.575 m)   Wt 86.2 kg   LMP 12/26/2022 (Exact Date)   BMI 34.75 kg/m  I/O last 3 completed shifts: In: 2 [I.V.:2] Out: -  No intake/output data recorded.  FHT:  FHR: 145 baseline bpm, variability: moderate,  accelerations:  Present,  decelerations:  Absent UC:   regular, every 3-4 minutes SVE:   Dilation: 4.5 Effacement (%): 90 Station: -3, -2 Exam by:: Eunice Blase, CNM Pitocin now at 49mu/min  Labs: Lab Results  Component Value Date   WBC 6.7 09/26/2023   HGB 13.0 09/26/2023   HCT 37.5 09/26/2023   MCV 94.0 09/26/2023   PLT 154 09/26/2023    Assessment / Plan: Induction of labor due to preeclampsia,  progressing well on pitocin  Labor: Progressing on Pitocin, will continue to increase then AROM Preeclampsia:  no signs or symptoms of toxicity Fetal Wellbeing:  Category I Pain Control:  Labor support without medications I/D:  n/a Anticipated MOD:  NSVD  [name], CNM 09/26/2023, 7:07 PM

## 2023-09-26 NOTE — Progress Notes (Signed)
Patient moved from Observation 4 to LDR 4, EFM and TOCO will be reapplied.

## 2023-09-26 NOTE — OB Triage Note (Signed)
Patient arrived to unit from the health department to rule out Pre-E. Patient is 39w today and denies HA. Visual changes, epigastric pain, decreased FM, or leakage of fluids. Patient states she has occasional back pain but cannot time them. EFM and TOCO explained and applied. CNM will be made aware of patient's arrival.

## 2023-09-26 NOTE — Progress Notes (Signed)
MARQUEL HAKE is a 32 y.o. G3P2002 at [redacted]w[redacted]d who continues  with labor.  She is more uncomfortable and has requested some IV pain medication. No SROM noted. Her husband is at the bedside and is very supportive. This patient has limited [place].  Objective: BP (!) 144/99   Pulse 67   Temp 98.2 F (36.8 C) (Oral)   Resp 18   Ht 5\' 2"  (1.575 m)   Wt 86.2 kg   LMP 12/26/2022 (Exact Date)   BMI 34.75 kg/m  I/O last 3 completed shifts: In: 2 [I.V.:2] Out: -  No intake/output data recorded.  FHT:  FHR: 145 baseline bpm, variability: moderate,  accelerations:  Present,  decelerations:  Absent UC:   regular, every 2.5-3.5 minutes SVE:   6-7 cms/100%/-2 Pitocin dose change to 37mu/min Labs: Lab Results  Component Value Date   WBC 6.7 09/26/2023   HGB 13.0 09/26/2023   HCT 37.5 09/26/2023   MCV 94.0 09/26/2023   PLT 154 09/26/2023    Assessment / Plan: Induction of labor due to preeclampsia,  progressing well on pitocin  Labor: Progressing normally. Will receive her second dose of PCN for her GBS status and then AROM. Preeclampsia:  no signs or symptoms of toxicity Fetal Wellbeing:  Category I Pain Control:  IV pain meds I/D:  n/a Anticipated MOD:  NSVD Delivery table is set up.  [name], CNM 09/26/2023, 8:10 PM

## 2023-09-27 ENCOUNTER — Encounter: Payer: Self-pay | Admitting: Obstetrics

## 2023-09-27 LAB — RPR: RPR Ser Ql: NONREACTIVE

## 2023-09-27 LAB — CBC
HCT: 37.4 % (ref 36.0–46.0)
Hemoglobin: 12.9 g/dL (ref 12.0–15.0)
MCH: 32.5 pg (ref 26.0–34.0)
MCHC: 34.5 g/dL (ref 30.0–36.0)
MCV: 94.2 fL (ref 80.0–100.0)
Platelets: 162 10*3/uL (ref 150–400)
RBC: 3.97 MIL/uL (ref 3.87–5.11)
RDW: 13.2 % (ref 11.5–15.5)
WBC: 13.2 10*3/uL — ABNORMAL HIGH (ref 4.0–10.5)
nRBC: 0 % (ref 0.0–0.2)

## 2023-09-27 MED ORDER — VARICELLA VIRUS VACCINE LIVE 1350 PFU/0.5ML IJ SUSR
0.5000 mL | Freq: Once | INTRAMUSCULAR | Status: AC
  Administered 2023-09-27: 0.5 mL via SUBCUTANEOUS
  Filled 2023-09-27: qty 0.5

## 2023-09-27 MED ORDER — MEASLES, MUMPS & RUBELLA VAC IJ SOLR
0.5000 mL | Freq: Once | INTRAMUSCULAR | Status: AC
  Administered 2023-09-27: 0.5 mL via SUBCUTANEOUS
  Filled 2023-09-27 (×2): qty 0.5

## 2023-09-27 NOTE — Lactation Note (Signed)
This note was copied from a baby's chart. Lactation Consultation Note  Patient Name: Diana Robertson [id number] Date: 09/27/2023 Age:33 hours Reason for consult: Initial assessment;Term   Maternal Data This is mom's 3rd baby, SVD. Mom with a history of preeclampsia with this baby and is overweight.  On initial visit mom reports she gave both the breast and formula with each of her children and that's what she plans to do this time. Mom states, "I don't have milk." Baby has just taken 30 ml's of formula 1 hour ago.  Has patient been taught Hand Expression?: Yes Does the patient have breastfeeding experience prior to this delivery?: Yes How long did the patient breastfeed?: mom breastfed 1st baby for 3 months and 2nd baby for 2 months. Per mom she combination fed her other children(breastfed and formula) and plans to do the same with this baby.  Feeding Mother's Current Feeding Choice: Breast Milk and Formula Nipple Type: Slow - flow  Reviewed with mom how the body knows to make milk and the importance of consistent stimulation to encourage an adequate milk supply. Discussed normative milk volumes in the early post partum period. Encouraged mom to offer the baby to breastfeed 1st with cues and at least 8-12 times in 24 hours. After breastfeeding mom can offer supplemental milk and follow baby's cues for satiety. It was explained to her in this way she can optimize her milk production as baby's suck will be the most effective when she is hungry. Mom reports baby latches it just that "I don't have milk." Discussed with mom if baby does not latch and breastfeed to use the Harmony manual pump to stimulate and empty her breasts.  Lactation Tools Discussed/Used  Harmony manual pump: reviewed set-up, cleaning of parts, and milk stoarge guidelines for the normal newborn at home.  Interventions Interventions: Breast feeding basics reviewed;Education;Hand pump  Discharge Discharge Education:  Engorgement and breast care;Warning signs for feeding baby;Outpatient recommendation Pump: Manual (Mom does not have a breastpump at home. Mom would like a manual pump for home use.)  Consult Status Consult Status: PRN  Update provided to care nurse.  All dialogue with parents with assistance of Spanish Interpreter via IPAD.  Fuller Song 09/27/2023, 5:03 PM

## 2023-09-27 NOTE — Progress Notes (Signed)
Post Partum Day 0 Subjective: Diana Robertson is feeling well overall. She is ambulating, voiding, and tolerating POs without difficulty. Her pain is well-controlled and her bleeding is WNL. Her mood is stable. She is breastfeeding and bottle feeding. She denies HA, visual changes, and epigastric pain. She strongly desires discharge this evening.  Objective: Blood pressure 111/69, pulse 78, temperature 98.8 F (37.1 C), temperature source Oral, resp. rate 18, height 5\' 2"  (1.575 m), weight 86.2 kg, last menstrual period 12/26/2022, SpO2 97%, unknown if currently breastfeeding.  Physical Exam:  General: alert, cooperative, and appears stated age Heart: RRR Lungg: CTAB Abdomen: soft, non-tender, normal BS Lochia: appropriate Uterine Fundus: firm Incision: N/A DVT Evaluation: No evidence of DVT seen on physical exam.  Recent Labs    09/26/23 1317 09/27/23 0539  HGB 13.0 12.9  HCT 37.5 37.4    Assessment/Plan: Discharge this evening if normotensive Reviewed discharge instructions, danger signs, when to return to the hospital with video interpreter assistance BP check in one week PP visit with [facility] in 6 weeks   LOS: 1 day   Glenetta Borg, CNM 09/27/2023, 10:40 AM

## 2023-09-27 NOTE — Progress Notes (Signed)
All discharge instructions reviewed with pt via interpreter; saline lock removed; pt discharged via wheelchair holding infant in her lap (infant in the car seat already) escorted by nurse tech; pt going home with significant other and new baby

## 2023-09-28 LAB — URINE CULTURE

## 2023-09-28 NOTE — Final Progress Note (Signed)
Post Partum Day 0 Subjective: Diana Robertson is feeling well overall. She is ambulating, voiding, and tolerating POs without difficulty. Her pain is well-controlled and her bleeding is WNL. Her mood is stable. She is breastfeeding and bottle feeding. She denies HA, visual changes, and epigastric pain. She strongly desires discharge this evening.   Objective: Blood pressure 111/69, pulse 78, temperature 98.8 F (37.1 C), temperature source Oral, resp. rate 18, height 5\' 2"  (1.575 m), weight 86.2 kg, last menstrual period 12/26/2022, SpO2 97%, unknown if currently breastfeeding.   Physical Exam:  General: alert, cooperative, and appears stated age Heart: RRR Lungg: CTAB Abdomen: soft, non-tender, normal BS Lochia: appropriate Uterine Fundus: firm Incision: N/A DVT Evaluation: No evidence of DVT seen on physical exam.   Recent Labs (last 2 labs)      Recent Labs    09/26/23 1317 09/27/23 0539  HGB 13.0 12.9  HCT 37.5 37.4        Assessment/Plan: Discharge this evening if normotensive Reviewed discharge instructions, danger signs, when to return to the hospital with video interpreter assistance BP check in one week PP visit with [facility] in 6 weeks    LOS: 1 day    Glenetta Borg, CNM 09/27/2023, 10:40 AM

## 2023-10-04 ENCOUNTER — Ambulatory Visit: Payer: Self-pay

## 2023-10-04 ENCOUNTER — Ambulatory Visit (INDEPENDENT_AMBULATORY_CARE_PROVIDER_SITE_OTHER): Payer: Self-pay

## 2023-10-04 VITALS — BP 133/104 | HR 87

## 2023-10-04 DIAGNOSIS — R030 Elevated blood-pressure reading, without diagnosis of hypertension: Secondary | ICD-10-CM

## 2023-10-04 DIAGNOSIS — Z0130 Encounter for examination of blood pressure without abnormal findings: Secondary | ICD-10-CM

## 2023-10-04 MED ORDER — NIFEDIPINE ER OSMOTIC RELEASE 30 MG PO TB24: 30.0000 mg | ORAL_TABLET | Freq: Every day | ORAL | 3 refills | Status: AC

## 2023-10-04 NOTE — Progress Notes (Signed)
    NURSE VISIT NOTE  Subjective:    Patient ID: Diana Robertson, female    DOB: [birth year], 32 y.o.   MRN: [id number]  HPI  Patient is a 32 y.o. G38P3003 female who presents for BP check per order from Guadlupe Spanish, CNM.   Patient reports compliance with prescribed BP medications: patient currently not on any medications Last dose of BP medication: N/A  BP Readings from Last 3 Encounters:  10/04/23 (!) 133/104  09/27/23 135/88  09/17/23 112/75   Pulse Readings from Last 3 Encounters:  10/04/23 87  09/27/23 83  09/17/23 67    Objective:    BP (!) 133/104 (Cuff Size: Large)   Pulse 87   Assessment:   1. Blood pressure check      Plan:   Per Guadlupe Spanish, CNM:  Plan: Sending out a protein creatinine ratio and starting Procardia 30mg  24 hr tablet once daily by mouth.   Patient verbalized understanding of instructions.   Burtis Junes, CMA

## 2023-10-04 NOTE — Progress Notes (Signed)
Diana Robertson is a [id number] who is s/p SVD on 09/26/23 after induction for preeclampsia without severe features. She presented to clinic for a BP check today and was found to have 2 MRBPs. She denies HA, visual changes, and epigastric pain. Discussed plan with Dr. Logan Bores. Will start on Procardia XL 30 mg today and collect preeclampsia labs. Return for a BP check in one week.  I discussed this plan via video interpreter with Diana Robertson and reviewed danger signs, proper medication administration, s/s of severe HTN, and when to return. Encouraged rest and minimizing activities. Rx sent to pharmacy on file. She verbalized understanding.  RTC in one week.  Diana Robertson, CNM

## 2023-10-05 ENCOUNTER — Telehealth: Payer: Self-pay

## 2023-10-05 ENCOUNTER — Telehealth: Payer: Self-pay | Admitting: Obstetrics

## 2023-10-05 LAB — CBC
Hematocrit: 44.1 % (ref 34.0–46.6)
Hemoglobin: 14.7 g/dL (ref 11.1–15.9)
MCH: 32.2 pg (ref 26.6–33.0)
MCHC: 33.3 g/dL (ref 31.5–35.7)
MCV: 97 fL (ref 79–97)
Platelets: 256 10*3/uL (ref 150–450)
RBC: 4.57 x10E6/uL (ref 3.77–5.28)
RDW: 13 % (ref 11.7–15.4)
WBC: 8.3 10*3/uL (ref 3.4–10.8)

## 2023-10-05 LAB — COMPREHENSIVE METABOLIC PANEL
ALT: 172 [IU]/L — ABNORMAL HIGH (ref 0–32)
AST: 96 [IU]/L — ABNORMAL HIGH (ref 0–40)
Albumin: 4 g/dL (ref 3.9–4.9)
Alkaline Phosphatase: 143 [IU]/L — ABNORMAL HIGH (ref 44–121)
BUN/Creatinine Ratio: 22 (ref 9–23)
BUN: 14 mg/dL (ref 6–20)
Bilirubin Total: 0.3 mg/dL (ref 0.0–1.2)
CO2: 20 mmol/L (ref 20–29)
Calcium: 10 mg/dL (ref 8.7–10.2)
Chloride: 103 mmol/L (ref 96–106)
Creatinine, Ser: 0.65 mg/dL (ref 0.57–1.00)
Globulin, Total: 3 g/dL (ref 1.5–4.5)
Glucose: 78 mg/dL (ref 70–99)
Potassium: 4.2 mmol/L (ref 3.5–5.2)
Sodium: 139 mmol/L (ref 134–144)
Total Protein: 7 g/dL (ref 6.0–8.5)
eGFR: 119 mL/min/{1.73_m2} (ref >59)

## 2023-10-05 LAB — PROTEIN / CREATININE RATIO, URINE
Creatinine, Urine: 95.3 mg/dL
Protein, Ur: 10 mg/dL
Protein/Creat Ratio: 105 mg/g{creat} (ref 0–200)

## 2023-10-05 NOTE — Telephone Encounter (Signed)
Preeclampsia labs returned with elevated LFTs. Diana Robertson informed and instructed to go to L&D for admission for preeclampsia with severe features. She verbalized understanding and states she will get her things together and proceed to L&D. On-call CNM and L&D notified.  Glenetta Borg, CNM

## 2023-10-05 NOTE — Telephone Encounter (Signed)
Called 3 more times phone goes straight to voicemail not set up.

## 2023-10-05 NOTE — Telephone Encounter (Signed)
Called pts's number again first time I talked to patient I was able to let her know the risks of not going to the hospital, and I also let the patient know how important it was for her to go and to be ready to spend the night or a couple of days at ED due to the abnormal blood work, Also advised for another adult to be there with her incase she had to take baby with her. Patients phone and husbands number both ring a couple of times then go straight to a voicemail that is not set up.

## 2023-10-08 NOTE — Telephone Encounter (Signed)
Called Pt again today to confirm she went to ED, phone rang once and went straight to voicemail that is not set up.

## 2023-10-11 ENCOUNTER — Ambulatory Visit: Payer: Self-pay

## 2023-10-11 NOTE — Progress Notes (Deleted)
    NURSE VISIT NOTE  Subjective:    Patient ID: Diana Robertson, female    DOB: March 28, 1991, 32 y.o.   MRN: [id number]  HPI  Patient is a 32 y.o. G87P3003 female who presents for BP check per order from Guadlupe Spanish, CNM.   Patient reports compliance with prescribed BP medications: {YES/NO/WILD CARDS:18581} Procardia 30 mgdaily Last dose of BP medication: {Elopement time:3044023}  BP Readings from Last 3 Encounters:  10/04/23 (!) 133/104  09/27/23 135/88  09/17/23 112/75   Pulse Readings from Last 3 Encounters:  10/04/23 87  09/27/23 83  09/17/23 67    Objective:    There were no vitals taken for this visit.  Assessment:   No diagnosis found.   Plan:   Per Dr. Andria Rhein Providers:28529}:  {YQMVHQ:46962:X}  Patient verbalized understanding of instructions.   Cornelius Moras, CMA

## 2024-01-14 ENCOUNTER — Encounter: Payer: Self-pay | Admitting: Family Medicine

## 2024-01-14 ENCOUNTER — Ambulatory Visit (LOCAL_COMMUNITY_HEALTH_CENTER): Admitting: Family Medicine

## 2024-01-14 ENCOUNTER — Ambulatory Visit

## 2024-01-14 DIAGNOSIS — Z3009 Encounter for other general counseling and advice on contraception: Secondary | ICD-10-CM | POA: Diagnosis not present

## 2024-01-14 DIAGNOSIS — Z30017 Encounter for initial prescription of implantable subdermal contraceptive: Secondary | ICD-10-CM | POA: Diagnosis not present

## 2024-01-14 DIAGNOSIS — Z113 Encounter for screening for infections with a predominantly sexual mode of transmission: Secondary | ICD-10-CM

## 2024-01-14 DIAGNOSIS — Z01419 Encounter for gynecological examination (general) (routine) without abnormal findings: Secondary | ICD-10-CM

## 2024-01-14 DIAGNOSIS — Z8759 Personal history of other complications of pregnancy, childbirth and the puerperium: Secondary | ICD-10-CM

## 2024-01-14 LAB — WET PREP FOR TRICH, YEAST, CLUE
Trichomonas Exam: NEGATIVE
Yeast Exam: NEGATIVE

## 2024-01-14 LAB — HEMOGLOBIN, FINGERSTICK: Hemoglobin: 13.3 g/dL (ref 11.1–15.9)

## 2024-01-14 LAB — HM HIV SCREENING LAB: HM HIV Screening: NEGATIVE

## 2024-01-14 MED ORDER — ETONOGESTREL 68 MG ~~LOC~~ IMPL
68.0000 mg | DRUG_IMPLANT | Freq: Once | SUBCUTANEOUS | Status: AC
  Administered 2024-01-14: 68 mg via SUBCUTANEOUS

## 2024-01-14 NOTE — Progress Notes (Addendum)
 [facility] [address], [address] [place] [address] Main phone: [phone]  Postpartum Visit  Subjective:  Diana Robertson is a 33 y.o. G73P3003 female who presents for a postpartum visit. She is  15  weeks postpartum following a normal spontaneous vaginal delivery. Patient was induced at [redacted]w[redacted]d due to pre-eclampsia.  I have fully reviewed the prenatal and intrapartum course. Postpartum course has been good.   Patient Active Problem List   Diagnosis Date Noted   History of pre-eclampsia 01/14/2024    Delivery The delivery was at [redacted]w[redacted]d gestational weeks.   Anesthesia: none.  Delivery complications: none Patient describes her labor and delivery as good.   Infant Baby is doing well- weight at delivery 6#7oz- baby girl.  Baby is feeding by bottle - Similac Advance.  GI & GU Bleeding: no bleeding.  Bowel function is normal.  Bladder function is normal.   Sexuality and Contraception Patient is sexually active.  Contraception method is  condoms .   The pregnancy intention screening data noted above was reviewed. Potential methods of contraception were discussed. The patient elected to proceed with No data recorded.  Mood Postpartum depression screening: negative. Flowsheet Row Routine Prenatal from 09/10/2023 in [facility]  PHQ-9 Total Score 2        Health Maintenance Health Maintenance Due  Topic Date Due   HPV VACCINES (2 - 3-dose series) 10/30/2016   COVID-19 Vaccine (1 - 2024-25 season) Never done   The following portions of the patient's history were reviewed and updated as appropriate: allergies, current medications, past family history, past medical history, past social history, past surgical history, and problem list.  Review of Systems Pertinent items are noted in HPI.  Objective:  BP 116/83 (BP Location: Right Arm, Patient Position: Sitting, Cuff Size: Normal)    Pulse 84   Wt 183 lb (83 kg)   BMI 33.47 kg/m    General:  alert and cooperative   Breasts:  not indicated  Lungs: Normal rate  Heart:  Normal rate  Abdomen: soft, non-tender; bowel sounds normal; no masses,  no organomegaly   Wound none  GU exam:  Ike Bene CMA present for vaginal exam- superficial labial abraison present       Assessment:   1. Postpartum care following vaginal delivery (Primary) -pt c/o burning- has  a superficial left labial abraison present -had labial lac after delivery- no sutures needed -should be healed at 15 weeks -counseled to use warm water soak BID with no soap -reports itching or discomfort when wearing underwear/pants- can use vaseline -appears to recently have shaved/used hair removal method- however patient declined that she used anything to remove pubic hair -testing today for STIs  -counseled to RTC if this does not improve   - Hemoglobin, fingerstick  2. History of pre-eclampsia -induced at [redacted]w[redacted]d due to pre-eclampsia- delivered vaginally- at 1 week pp BP check-elevated BP , started on pro-cardia 30mg - LFTs came back elevated and recommended she go to the ER for pre-eclampsia with severe features- clinic was unable to contact patient after several attempts -BP  today significantly improved 116/83 -encouraged to get a PCP for continued monitoring of BP  3. Family planning  - etonogestrel (NEXPLANON) implant 68 mg  4. Encounter for initial prescription of Nexplanon Nexplanon Insertion Procedure Patient identified, informed consent performed, consent signed.   Patient does understand that irregular bleeding is a very common side effect of this medication. She was advised  to have backup contraception after placement. Patient was determined to meet WHO criteria for not being pregnant. Appropriate time out taken.  The insertion site was identified 8-10 cm (3-4 inches) from the medial epicondyle of the humerus and 3-5 cm (1.25-2 inches) posterior  to (below) the sulcus (groove) between the biceps and triceps muscles of the patient's left arm and marked.  Patient was prepped with alcohol swab and then injected with 3 ml of 1% lidocaine.  Arm was prepped with chlorhexidene, Nexplanon removed from packaging,  Device confirmed in needle, then inserted full length of needle and withdrawn per handbook instructions. Nexplanon was able to palpated in the patient's arm; patient palpated the insert herself. There was minimal blood loss.  Patient insertion site covered with guaze and a pressure bandage to reduce any bruising.  The patient tolerated the procedure well and was given post procedure instructions.    Nexplanon:   Counseled patient to take OTC analgesic starting as soon as lidocaine starts to wear off and take regularly for at least 48 hr to decrease discomfort.  Specifically to take with food or milk to decrease stomach upset and for IB 600 mg (3 tablets) every 6 hrs; IB 800 mg (4 tablets) every 8 hrs; or Aleve 2 tablets every 12 hrs.    5. Screening for venereal disease  - Chlamydia/Gonorrhea [facility] - HIV [place] [facility] - Syphilis Serology, [facility] - WET PREP FOR TRICH, YEAST, CLUE   Plan:   Essential components of care per ACOG recommendations:  1.  Mood and well being: Patient with negative depression screening today. Reviewed local resources for support.  - Patient tobacco use? No.   - hx of drug use? No.    2. Infant care and feeding:  -Patient currently breastmilk feeding? No.  -Social determinants of health (SDOH) reviewed in EPIC. No concerns  3. Sexuality, contraception and birth spacing - Patient does not want a pregnancy in the next year.  Desired family size is 3 children.  - Reviewed reproductive life planning. Reviewed options based on patient desire and reproductive life plan. Patient is interested in Hormonal Implant. This was provided to the patient today.   Risks, benefits, and typical effectiveness  rates were reviewed.  Questions were answered.  Written information was also given to the patient to review.    The patient will follow up in  1 years for surveillance.  The patient was told to call with any further questions, or with any concerns about this method of contraception.  Emphasized use of condoms 100% of the time for STI prevention.  Patient was not offered ECP based on using condoms with each sexual encounter  - Discussed birth spacing of 18 months  4. Sleep and fatigue -Encouraged family/partner/community support of 4 hrs of uninterrupted sleep to help with mood and fatigue  5. Physical Recovery  - Discussed patients delivery and complications. She describes her labor as good. - Patient had a Vaginal, no problems at delivery. Patient had a  superficial perineal  laceration. Perineal healing reviewed. Patient expressed understanding - Patient has urinary incontinence? No. - Patient is safe to resume physical and sexual activity  6.  Health Maintenance - HM due items addressed No - none - Last pap smear  05/18/22. Pap smear not done at today's visit.  -Breast Cancer screening indicated? No.   7. Chronic Disease/Pregnancy Condition follow up: HTN  - PCP follow up Due to language barrier, a [place] interpreter (  [employer], [place]   ) was present by phone during the history-taking, subsequent discussion, and physical exam with this patient.    Lenice Llamas, FNP [facility]

## 2024-01-14 NOTE — Progress Notes (Signed)
 Patient is here for Annual and Nexplanon. FP packet given, contents  and wet prep results reviewed with pt, no treatment required per standing order. Nexplanon inserted at the Lt Arm by Lenice Llamas, FNP and Pt tolerated well to procedure. Condoms declined and instruction on how to care for the arm given as well. Sonda Primes, RN.
